# Patient Record
Sex: Female | Born: 1980 | Race: Black or African American | Hispanic: No | Marital: Single | Smoking: Never smoker
Health system: Southern US, Community
[De-identification: ages and names within clinical notes are randomized; demographics above are authoritative.]

## PROBLEM LIST (undated history)

## (undated) DIAGNOSIS — D649 Anemia, unspecified: Secondary | ICD-10-CM

## (undated) HISTORY — DX: Anemia, unspecified: D64.9

## (undated) HISTORY — PX: UTERINE FIBROID SURGERY: SHX826

## (undated) HISTORY — PX: TOOTH EXTRACTION: SUR596

---

## 2009-01-25 ENCOUNTER — Emergency Department (HOSPITAL_COMMUNITY): Admission: EM | Admit: 2009-01-25 | Discharge: 2009-01-25 | Payer: Self-pay | Admitting: Emergency Medicine

## 2013-04-26 ENCOUNTER — Emergency Department (HOSPITAL_COMMUNITY)
Admission: EM | Admit: 2013-04-26 | Discharge: 2013-04-27 | Disposition: A | Discharge status: Home / Self Care | Payer: 59 | Attending: Emergency Medicine | Admitting: Emergency Medicine

## 2013-04-26 ENCOUNTER — Encounter (HOSPITAL_COMMUNITY): Payer: Self-pay | Admitting: Emergency Medicine

## 2013-04-26 DIAGNOSIS — B9789 Other viral agents as the cause of diseases classified elsewhere: Secondary | ICD-10-CM | POA: Insufficient documentation

## 2013-04-26 DIAGNOSIS — B349 Viral infection, unspecified: Secondary | ICD-10-CM

## 2013-04-26 DIAGNOSIS — Z8742 Personal history of other diseases of the female genital tract: Secondary | ICD-10-CM | POA: Insufficient documentation

## 2013-04-26 DIAGNOSIS — R112 Nausea with vomiting, unspecified: Secondary | ICD-10-CM | POA: Insufficient documentation

## 2013-04-26 NOTE — ED Notes (Signed)
Pt arrived to the ED with a complaint of a headache and generalized body aches.  Pt also has intermittent abdominal pain after eating.  Pt states she has had 4 bouts of emesis in the last 24 hours.  Pt states her symptoms started yesterday am.  Pt states she has no hx of headaches.  Pt's pain in the head is all over.

## 2013-04-27 MED ORDER — ONDANSETRON 8 MG PO TBDP
8.0000 mg | ORAL_TABLET | Freq: Once | ORAL | Status: AC
  Administered 2013-04-27: 8 mg via ORAL
  Filled 2013-04-27: qty 1

## 2013-04-27 MED ORDER — PROMETHAZINE HCL 25 MG PO TABS: 25.0000 mg | ORAL_TABLET | Freq: Four times a day (QID) | ORAL | Status: AC | PRN

## 2013-04-27 NOTE — ED Provider Notes (Signed)
CSN: [id number]     Arrival date & time 04/26/13  2211 History   First MD Initiated Contact with Patient 04/26/13 2357     Chief Complaint  Patient presents with  . Headache  . Generalized Body Aches   HPI  History provided by the patient. Patient is a 33 year old female with history of uterine fibroid surgery otherwise healthy who presents with complaints of general body aches, headache and nausea and vomiting symptoms. Patient reports symptoms began 2 days ago with episode of nausea and vomiting early in the day. She began to feel slightly improved and took some Aleve and went to work as normal. She was turned home she was able to sleep normally. Early this morning she had some similar feelings without any nausea vomiting but was able to go to work normally. When she returned home she began having body aches, headache and episodes of nausea and vomiting. She tried taking Aleve again without any improvement. She reports having intermittent headaches and occasional intermittent abdominal pains. She denies having any associated fever, chills or sweats. No diarrhea or constipation symptoms. Denies any urinary complaints. Last normal menstrual cycle was February 14. She does not believe she is pregnant. No other aggravating or alleviating factors. No other associated symptoms. No recent travel or sick contacts.    History reviewed. No pertinent past medical history. Past Surgical History  Procedure Laterality Date  . Uterine fibroid surgery    . Tooth extraction     No family history on file. History  Substance Use Topics  . Smoking status: Never Smoker   . Smokeless tobacco: Not on file  . Alcohol Use: No   OB History   Grav Para Term Preterm Abortions TAB SAB Ect Mult Living                 Review of Systems  Constitutional: Positive for appetite change. Negative for fever, chills, diaphoresis and fatigue.  HENT: Negative for congestion, rhinorrhea and sinus pressure.   Eyes:  Negative for visual disturbance.  Respiratory: Negative for cough.   Gastrointestinal: Positive for nausea and vomiting. Negative for abdominal pain, diarrhea, constipation and blood in stool.  Endocrine: Negative for polydipsia and polyuria.  Genitourinary: Negative for dysuria, frequency, hematuria, flank pain, vaginal bleeding, vaginal discharge and pelvic pain.      Allergies  Review of patient's allergies indicates no known allergies.  Home Medications   Current Outpatient Rx  Name  Route  Sig  Dispense  Refill  . promethazine (PHENERGAN) 25 MG tablet   Oral   Take 1 tablet (25 mg total) by mouth every 6 (six) hours as needed for nausea.   20 tablet   0    BP 128/59  Pulse 94  Temp(Src) 98.3 F (36.8 C) (Oral)  Resp 17  SpO2 100%  LMP 03/26/2013 Physical Exam  Nursing note and vitals reviewed. Constitutional: She is oriented to person, place, and time. She appears well-developed and well-nourished. No distress.  HENT:  Head: Normocephalic.  Right Ear: Tympanic membrane normal.  Left Ear: Tympanic membrane normal.  Mouth/Throat: Oropharynx is clear and moist.  Eyes: Conjunctivae and EOM are normal. Pupils are equal, round, and reactive to light.  Neck: Normal range of motion. Neck supple.  Cardiovascular: Normal rate and regular rhythm.   No murmur heard. Pulmonary/Chest: Effort normal and breath sounds normal. No respiratory distress. She has no wheezes. She has no rales.  Abdominal: Soft. She exhibits no distension. There is no tenderness. There  is no rebound and no guarding.  Musculoskeletal: Normal range of motion. She exhibits no edema and no tenderness.  Lymphadenopathy:    She has no cervical adenopathy.  Neurological: She is alert and oriented to person, place, and time.  Skin: Skin is warm and dry. No rash noted.  Psychiatric: She has a normal mood and affect. Her behavior is normal.    ED Course  Procedures   DIAGNOSTIC STUDIES: Oxygen Saturation  is 100% on room air.    COORDINATION OF CARE:  Nursing notes reviewed. Vital signs reviewed. Initial pt interview and examination performed.   1:15 AM-patient seen and evaluated. Patient well-appearing no acute distress. Afebrile at triage. Unremarkable vital signs. Unremarkable examination. I discussed with patient options for some basic workup to evaluate her symptoms including urine pregnancy test, CBG or basic lab tests. At this time patient does not wish to stay to have additional testing and prefers to continue to monitor symptoms for the next few days. She was strongly encouraged to return if she has any change or worsening symptoms or if her symptoms persist for more than one week. She agrees with this plan.    Treatment plan initiated: Medications  ondansetron (ZOFRAN-ODT) disintegrating tablet 8 mg (8 mg Oral Given 04/27/13 0114)       MDM   Final diagnoses:  Viral infection        Angus Seller, PA-C 04/27/13 0120

## 2013-04-27 NOTE — ED Provider Notes (Signed)
Medical screening examination/treatment/procedure(s) were performed by non-physician practitioner and as supervising physician I was immediately available for consultation/collaboration.   Dione Boozeavid Moriyah Byington, MD 04/27/13 [phone]

## 2013-04-27 NOTE — Discharge Instructions (Signed)
Please followup with a primary care provider for continued evaluation and treatment. Return at any time for changing or worsening symptoms.    Viral Infections A virus is a type of germ. Viruses can cause:  Minor sore throats.  Aches and pains.  Headaches.  Runny nose.  Rashes.  Watery eyes.  Tiredness.  Coughs.  Loss of appetite.  Feeling sick to your stomach (nausea).  Throwing up (vomiting).  Watery poop (diarrhea). HOME CARE   Only take medicines as told by your doctor.  Drink enough water and fluids to keep your pee (urine) clear or pale yellow. Sports drinks are a good choice.  Get plenty of rest and eat healthy. Soups and broths with crackers or rice are fine. GET HELP RIGHT AWAY IF:   You have a very bad headache.  You have shortness of breath.  You have chest pain or neck pain.  You have an unusual rash.  You cannot stop throwing up.  You have watery poop that does not stop.  You cannot keep fluids down.  You or your child has a temperature by mouth above 102 F (38.9 C), not controlled by medicine.  Your baby is older than 3 months with a rectal temperature of 102 F (38.9 C) or higher.  Your baby is 34 months old or younger with a rectal temperature of 100.4 F (38 C) or higher. MAKE SURE YOU:   Understand these instructions.  Will watch this condition.  Will get help right away if you are not doing well or get worse. Document Released: 01/28/2008 Document Revised: 05/09/2011 Document Reviewed: 06/22/2010 [facility] Patient Information 2014 [place], [place].    Emergency Department Resource Guide 1) Find a Doctor and Pay Out of Pocket Although you won't have to find out who is covered by your insurance plan, it is a good idea to ask around and get recommendations. You will then need to call the office and see if the doctor you have chosen will accept you as a new patient and what types of options they offer for patients who are  self-pay. Some doctors offer discounts or will set up payment plans for their patients who do not have insurance, but you will need to ask so you aren't surprised when you get to your appointment.  2) Contact Your Local Health Department Not all health departments have doctors that can see patients for sick visits, but many do, so it is worth a call to see if yours does. If you don't know where your local health department is, you can check in your phone book. The CDC also has a tool to help you locate your state's health department, and many state websites also have listings of all of their local health departments.  3) Find a Walk-in Clinic If your illness is not likely to be very severe or complicated, you may want to try a walk in clinic. These are popping up all over the country in pharmacies, drugstores, and shopping centers. They're usually staffed by nurse practitioners or physician assistants that have been trained to treat common illnesses and complaints. They're usually fairly quick and inexpensive. However, if you have serious medical issues or chronic medical problems, these are probably not your best option.  No Primary Care Doctor: - Call Health Connect at  [phone] - they can help you locate a primary care doctor that  accepts your insurance, provides certain services, etc. - Physician Referral Service- [phone]  Chronic Pain Problems: Organization  Address  Phone   Notes  [facility]  [phone] Patients need to be referred by their primary care doctor.   Medication Assistance: Organization         Address  Phone   Notes  [facility] Medication [facility] [address] [place], [place] [address] [phone] --Must be a resident of [place] -- Must have NO insurance coverage whatsoever (no Medicaid/ Medicare, etc.) -- The pt. MUST have a primary care doctor that directs their care regularly and follows them in  the community   Med[facility]ist  [phone]) [phone]   [employer]  [phone]    Agencies that provide inexpensive medical care: Organization         Address  Phone   Notes  [facility]  [phone]   [facility]    [phone]   [facility] [address] [place], [place] [address] [phone]   [facility] [address], [place] [phone]) [phone]   [facility]anned Parenthood    [phone]) [phone]   [facility]    [phone]) [phone]   [facility] and [facility]  [address], [place] Phone:  [phone], Fax:  [phone] Hours of Operation:  9 am - 6 pm, M-F.  Also accepts Medicaid/Medicare and self-pay.  [facility]  [address], [place] Phone: [phone]) [phone], Fax: [phone]) [phone]. Hours of Operation:  8:30 am - 5:30 pm, M-F.  Also accepts Medicaid and self-pay.  [facility] [place] [address], [place] Phone: [phone]) [phone]   [facility] [address], [place], [place] [phone], Ext. [phone] Mondays & Thursdays: 7-9 AM.  First 15 patients are seen on a first come, first serve basis.    Medicaid-accepting [facility] Providers:  Organization         Address  Phone   Notes  [facility] [address], [place] [phone] Also accepts self-pay patients.  [facility] [address], [place]  [phone]   [facility] [address], [place] [phone]) [phone]   [facility] [address] [place] [address], [place] [phone]) [phone]   Renaye RakersVeita Bland [address], [place]   [phone] Only accepts [place] Access [place] patients after they have their name applied to their card.   Self-Pay (no insurance) in [facility] [place]:  Organization         Address  Phone   Notes  Sickle Cell Patients,  [facility] [address], [place] [phone]) [phone]   [facility] [address], [place] [phone]) [phone]   [facility] [place]  [address], [place] [phone]   [facility]/Dr. Osei-Bonsu  [address] [place] [address], [place] or [phone], [place] [phone] Phone number for both [place] and [place] locations is the same.  [facility] [address], [place] [phone]) [phone]   [facility] [place] [address] [place] [address], [place] or [address] [phone]) [phone]   [facility] [address], [place] [phone]) [phone], phone; [phone]) [phone], fax Sees patients 1st and 3rd Saturday of every month.  Must not qualify  for public or private insurance (i.e. Medicaid, Medicare, [place] Health Choice, Veterans' Benefits)  Household income should be no more than 200% of the poverty level The clinic cannot treat you if you are pregnant or think you are pregnant  Sexually transmitted diseases are not treated at the clinic.    Dental Care: Organization         Address  Phone  Notes  [facility] [address] [place], [place] [phone] Accepts children up to age 72 who are enrolled in [place] or [place] Health Choice; pregnant women with a Medicaid card; and children who have applied for Medicaid or [place] Health Choice, but were declined, whose parents can pay a reduced fee at time of service.  [facility] of [facility]  [address], [place] [phone] Accepts children up to age 22 who are enrolled in [place] or [place] Health Choice; pregnant women with a Medicaid card; and children who have applied for Medicaid or [place] Health Choice, but were declined, whose parents can pay a reduced fee at time of service.  [facility]   [address] [place], [place] [phone] Patients are seen by appointment only. Walk-ins are not accepted. [facility] will see patients 3 years of age and older. Monday - Tuesday (8am-5pm) Most Wednesdays (8:30-5pm) $30 per visit, cash only  [facility] Adult Dental Access PROGRAM  [address], [facility] [phone] Patients are seen by appointment only. Walk-ins are not accepted. [facility] will see patients 31 years of age and older. One Wednesday Evening (Monthly: Volunteer Based).  $30 per visit, cash only  [employer]  [phone] for adults; Children under age 52, call Graduate Pediatric Dentistry at [phone]. Children aged 10-14, please call [phone] to request a pediatric application.  Dental services are provided in all areas of dental care including fillings, crowns and bridges, complete and partial dentures, implants, gum treatment, root canals, and extractions. Preventive care is also provided. Treatment is provided to both adults and children. Patients are selected via a lottery and there is often a waiting list.   [facility] [address], [place]  [phone] [email or web address]   [facility] [address] [place], [place] [phone], Ext. [phone] Second and Fourth Thursday of each month, opens at 6:30 AM; Clinic ends at 9 AM.  Patients are seen on a first-come first-served basis, and a limited number are seen during each clinic.   [facility]  [address] [place], [place] [phone]   Eligibility Requirements You must have lived in [place], [place], or [place] counties for at least the last three months.   You cannot be eligible for state or federal sponsored [place], including [employer], [place], or [employer].   You generally cannot be eligible for healthcare insurance through your employer.    How to apply: Eligibility screenings are  held every Tuesday and Wednesday afternoon from 1:00 pm until 4:00 pm. You do not need an appointment for the interview!  [facility] [address], [place], [place] [phone]   [facility] Health Department  [phone]   [facility] Health Department  [phone]   [facility] Health Department  [phone]    Behavioral Health Resources in the Community: Intensive Outpatient Programs Organization         Address  Phone  Notes  [facility] [address] [place], [place] [phone]   [facility] [address] [place], [place] [phone]   [facility]: [facility] Drug Sv[facility] [address], [place], [place]  [phone]   [facility] Mental Health [address]  [place] [employer], [place] [phone] or [phone]   Substance Abuse Resources Organization         Address  Phone  Notes  Alcohol and Drug Services  [phone]   [facility]  [phone]   [facility] [place]  [phone]   Floydene Flock  [phone]   Residential & Outpatient Substance Abuse Program  [phone]   Psychological Services Organization         Address  Phone  Notes  [facility] Behavioral Health  [phone]   [facility] Services  [phone]   [facility] Mental Health [address] [place] [phone] or [phone]    Mobile Crisis Teams Organization         Address  Phone  Notes  [facility], Mobile Crisis Care Unit  [phone]   [facility]  [address] [place], [place] [phone]   Doristine Locks [address] [place] [phone]    Self-Help/Support Groups Organization         Address  Phone             Notes  [facility]oc[facility] of [place] - variety of support groups  [phone]- I7437963 Call for more information  Narcotics Anonymous (NA), [facility] [address], [employer] [place]  2 meetings at this location     Statistician         Address  Phone  Notes  [facility] [address],    [place]  [phone]   [facility]  [address] [place] [address], [place], [place] [phone]   [facility] [address] [place], [place] [phone] Admissions: 8am-3pm M-F  [facility] [address],    [place], [place] [phone]   [facility] [address] [place], [place], [place] [phone]   [facility] [address],  [place], [place] [phone]   [facility] - Intensive Outpatient [address], [place], [place] [phone]   [facility] (Addiction [facility] [address] [place]  [place], [place] [phone] or [phone]   [facility] ([facility]) [address] [place], [place] [phone] Accepts Medicaid  [facility] [address]  [place] [phone] Substance Abuse/Addiction Treatment   [facility] Organization         Address  Phone  Notes  [facility]  [phone]   Angie Fava, PhD [address] [place], [place]   [phone] or [phone]   [facility] Behavioral   [address] [place], [place] [phone]   [facility] [address] [place], [place] [phone] Insurance/Medicaid/sponsorship through [facility] [address]                                    [place], [place] [phone] Therapy/tele-psych/case  [facility] [address] [place], [place] [phone]    Dr. Lolly Mustache  [phone]   [facility]  [address]  [facility] Dept. 1) [address] [place] 2) [address], [place] 3)  [address], [place] [phone]  [phone]   [facility] Child Abuse Hotline 979-678-9356 or [phone] (After  Hours)

## 2015-05-26 ENCOUNTER — Encounter (HOSPITAL_COMMUNITY): Payer: Self-pay

## 2015-05-26 ENCOUNTER — Emergency Department (HOSPITAL_COMMUNITY)
Admission: EM | Admit: 2015-05-26 | Discharge: 2015-05-26 | Disposition: A | Discharge status: Home / Self Care | Payer: BLUE CROSS/BLUE SHIELD | Attending: Emergency Medicine | Admitting: Emergency Medicine

## 2015-05-26 DIAGNOSIS — K625 Hemorrhage of anus and rectum: Secondary | ICD-10-CM | POA: Insufficient documentation

## 2015-05-26 DIAGNOSIS — Z86018 Personal history of other benign neoplasm: Secondary | ICD-10-CM | POA: Diagnosis not present

## 2015-05-26 DIAGNOSIS — D649 Anemia, unspecified: Secondary | ICD-10-CM

## 2015-05-26 LAB — TYPE AND SCREEN
ABO/RH(D): A POS
ANTIBODY SCREEN: NEGATIVE

## 2015-05-26 LAB — CBC
HEMATOCRIT: 30 % — AB (ref 36.0–46.0)
Hemoglobin: 9.7 g/dL — ABNORMAL LOW (ref 12.0–15.0)
MCH: 24.5 pg — ABNORMAL LOW (ref 26.0–34.0)
MCHC: 32.3 g/dL (ref 30.0–36.0)
MCV: 75.8 fL — ABNORMAL LOW (ref 78.0–100.0)
Platelets: 372 10*3/uL (ref 150–400)
RBC: 3.96 MIL/uL (ref 3.87–5.11)
RDW: 15 % (ref 11.5–15.5)
WBC: 6.6 10*3/uL (ref 4.0–10.5)

## 2015-05-26 LAB — COMPREHENSIVE METABOLIC PANEL
ALBUMIN: 4.3 g/dL (ref 3.5–5.0)
ALK PHOS: 66 U/L (ref 38–126)
ALT: 12 U/L — AB (ref 14–54)
AST: 14 U/L — AB (ref 15–41)
Anion gap: 9 (ref 5–15)
BUN: 15 mg/dL (ref 6–20)
CO2: 24 mmol/L (ref 22–32)
Calcium: 9.2 mg/dL (ref 8.9–10.3)
Chloride: 106 mmol/L (ref 101–111)
Creatinine, Ser: 0.63 mg/dL (ref 0.44–1.00)
GFR calc Af Amer: >60 mL/min (ref >60)
GFR calc non Af Amer: >60 mL/min (ref >60)
Glucose, Bld: 97 mg/dL (ref 65–99)
POTASSIUM: 3.6 mmol/L (ref 3.5–5.1)
Sodium: 139 mmol/L (ref 135–145)
TOTAL PROTEIN: 8 g/dL (ref 6.5–8.1)
Total Bilirubin: 0.4 mg/dL (ref 0.3–1.2)

## 2015-05-26 LAB — ABO/RH: ABO/RH(D): A POS

## 2015-05-26 LAB — POC OCCULT BLOOD, ED: Fecal Occult Bld: NEGATIVE

## 2015-05-26 NOTE — ED Notes (Addendum)
Pt with rectal bleeding.  Pt with pressure before and after bm.  Pt with nausea.  Pt straining.  Has happened before but didn't last this long.  No pain.  Pt has hx of anemia

## 2015-05-26 NOTE — Discharge Instructions (Signed)
Schedule a follow up appointment with [facility]. You will need to restart your iron pills as your hemoglobin was low today. Schedule a follow up appointment with a PCP to monitor this. Return to ED with new, worsening or concerning symptoms.    Gastrointestinal Bleeding Gastrointestinal (GI) bleeding means there is bleeding somewhere along the digestive tract, between the mouth and anus. CAUSES  There are many different problems that can cause GI bleeding. Possible causes include:  Esophagitis. This is inflammation, irritation, or swelling of the esophagus.  Hemorrhoids.These are veins that are full of blood (engorged) in the rectum. They cause pain, inflammation, and may bleed.  Anal fissures.These are areas of painful tearing which may bleed. They are often caused by passing hard stool.  Diverticulosis.These are pouches that form on the colon over time, with age, and may bleed significantly.  Diverticulitis.This is inflammation in areas with diverticulosis. It can cause pain, fever, and bloody stools, although bleeding is rare.  Polyps and cancer. Colon cancer often starts out as precancerous polyps.  Gastritis and ulcers.Bleeding from the upper gastrointestinal tract (near the stomach) may travel through the intestines and produce black, sometimes tarry, often bad smelling stools. In certain cases, if the bleeding is fast enough, the stools may not be black, but red. This condition may be life-threatening. SYMPTOMS   Vomiting bright red blood or material that looks like coffee grounds.  Bloody, black, or tarry stools. DIAGNOSIS  Your caregiver may diagnose your condition by taking your history and performing a physical exam. More tests may be needed, including:  X-rays and other imaging tests.  Esophagogastroduodenoscopy (EGD). This test uses a flexible, lighted tube to look at your esophagus, stomach, and small intestine.  Colonoscopy. This test uses a  flexible, lighted tube to look at your colon. TREATMENT  Treatment depends on the cause of your bleeding.   For bleeding from the esophagus, stomach, small intestine, or colon, the caregiver doing your EGD or colonoscopy may be able to stop the bleeding as part of the procedure.  Inflammation or infection of the colon can be treated with medicines.  Many rectal problems can be treated with creams, suppositories, or warm baths.  Surgery is sometimes needed.  Blood transfusions are sometimes needed if you have lost a lot of blood. If bleeding is slow, you may be allowed to go home. If there is a lot of bleeding, you will need to stay in the hospital for observation. HOME CARE INSTRUCTIONS   Take any medicines exactly as prescribed.  Keep your stools soft by eating foods that are high in fiber. These foods include whole grains, legumes, fruits, and vegetables. Prunes (1 to 3 a day) work well for many people.  Drink enough fluids to keep your urine clear or pale yellow. SEEK IMMEDIATE MEDICAL CARE IF:   Your bleeding increases.  You feel lightheaded, weak, or you faint.  You have severe cramps in your back or abdomen.  You pass large blood clots in your stool.  Your problems are getting worse. MAKE SURE YOU:   Understand these instructions.  Will watch your condition.  Will get help right away if you are not doing well or get worse.   This information is not intended to replace advice given to you by your health care provider. Make sure you discuss any questions you have with your health care provider.   Document Released: 02/12/2000 Document Revised: 02/01/2012 Document Reviewed: 08/04/2014 Elsevier Interactive Patient Education 2016 [employer]. Iron Deficiency  Anemia, Adult Anemia is a condition in which there are less red blood cells or hemoglobin in the blood than normal. Hemoglobin is the part of red blood cells that carries oxygen. Iron deficiency anemia is anemia  caused by too little iron. It is the most common type of anemia. It may leave you tired and short of breath. CAUSES   Lack of iron in the diet.  Poor absorption of iron, as seen with intestinal disorders.  Intestinal bleeding.  Heavy periods. SIGNS AND SYMPTOMS  Mild anemia may not be noticeable. Symptoms may include:  Fatigue.  Headache.  Pale skin.  Weakness.  Tiredness.  Shortness of breath.  Dizziness.  Cold hands and feet.  Fast or irregular heartbeat. DIAGNOSIS  Diagnosis requires a thorough evaluation and physical exam by your health care provider. Blood tests are generally used to confirm iron deficiency anemia. Additional tests may be done to find the underlying cause of your anemia. These may include:  Testing for blood in the stool (fecal occult blood test).  A procedure to see inside the colon and rectum (colonoscopy).  A procedure to see inside the esophagus and stomach (endoscopy). TREATMENT  Iron deficiency anemia is treated by correcting the cause of the deficiency. Treatment may involve:  Adding iron-rich foods to your diet.  Taking iron supplements. Pregnant or breastfeeding women need to take extra iron because their normal diet usually does not provide the required amount.  Taking vitamins. Vitamin C improves the absorption of iron. Your health care provider may recommend that you take your iron tablets with a glass of orange juice or vitamin C supplement.  Medicines to make heavy menstrual flow lighter.  Surgery. HOME CARE INSTRUCTIONS   Take iron as directed by your health care provider.  If you cannot tolerate taking iron supplements by mouth, talk to your health care provider about taking them through a vein (intravenously) or an injection into a muscle.  For the best iron absorption, iron supplements should be taken on an empty stomach. If you cannot tolerate them on an empty stomach, you may need to take them with food.  Do not drink  milk or take antacids at the same time as your iron supplements. Milk and antacids may interfere with the absorption of iron.  Iron supplements can cause constipation. Make sure to include fiber in your diet to prevent constipation. A stool softener may also be recommended.  Take vitamins as directed by your health care provider.  Eat a diet rich in iron. Foods high in iron include liver, lean beef, whole-grain bread, eggs, dried fruit, and dark green leafy vegetables. SEEK IMMEDIATE MEDICAL CARE IF:   You faint. If this happens, do not drive. Call your local emergency services (911 in [place] if no other help is available.  You have chest pain.  You feel nauseous or vomit.  You have severe or increased shortness of breath with activity.  You feel weak.  You have a rapid heartbeat.  You have unexplained sweating.  You become light-headed when getting up from a chair or bed. MAKE SURE YOU:   Understand these instructions.  Will watch your condition.  Will get help right away if you are not doing well or get worse.   This information is not intended to replace advice given to you by your health care provider. Make sure you discuss any questions you have with your health care provider.   Document Released: 02/12/2000 Document Revised: 03/07/2014 Document Reviewed: 10/22/2012 Elsevier Interactive  Patient Education 2016 [employer].

## 2015-05-26 NOTE — ED Provider Notes (Signed)
CSN: [id number]     Arrival date & time 05/26/15  1354 History   First MD Initiated Contact with Patient 05/26/15 1709     Chief Complaint  Patient presents with  . Rectal Bleeding   HPI  [name] is a 35 year old female with PMHx of iron deficiency anemia and uterine fibroids presenting with rectal bleeding. She reports sensation of lower pelvic pressure that radiates through to her rectum over the past week. The sensation is most severe before and after bowel movements. She had some bright red blood per rectum intermittently over the past week. She reports one episode of straining to have a bowel movement but otherwise has been having normal stools. She does note increased frequency of bowel movements. No diarrhea. Over the past 3 days, the volume of bleeding has increased. She states that the blood is dark red and she occasionally passes clots. She states that she drips blood when she is sitting on the toilet. Denies rectal bleeding between bowel movements. She has been wearing a pad over the past week to prevent bleeding but has never noted blood on it. She also endorses mild associated nausea. She also notes that she has lost 20 pounds in the past few months. She states that her sister recently had cancerous polyps removed from her colon. She states that her sister is a private person so she does not know much more about it than that. She denies a family history of colon cancer. She does note a long history of iron deficiency anemia. She is supposed to be taking iron pills but does not like the side effects. Her primary care provider retired so no one has been following her anemia. She is unsure what her hemoglobin typically is. She has no other complaints today. Denies fevers, chills, night sweats, dizziness, lightheadedness, syncope, chest pain, shortness of breath or vaginal bleeding.   History reviewed. No pertinent past medical history. Past Surgical History  Procedure Laterality Date  .  Uterine fibroid surgery    . Tooth extraction     History reviewed. No pertinent family history. Social History  Substance Use Topics  . Smoking status: Never Smoker   . Smokeless tobacco: None  . Alcohol Use: No   OB History    No data available     Review of Systems  All other systems reviewed and are negative.     Allergies  Review of patient's allergies indicates no known allergies.  Home Medications   Prior to Admission medications   Medication Sig Start Date End Date Taking? Authorizing Provider  ibuprofen (ADVIL,MOTRIN) 200 MG tablet Take 400 mg by mouth every 6 (six) hours as needed for headache.   Yes Historical Provider, MD  promethazine (PHENERGAN) 25 MG tablet Take 1 tablet (25 mg total) by mouth every 6 (six) hours as needed for nausea. Patient not taking: Reported on 05/26/2015 04/27/13   Ivonne AndrewPeter Dammen, PA-C   BP 133/81 mmHg  Pulse 90  Temp(Src) 98.1 F (36.7 C) (Oral)  Resp 17  SpO2 100%  LMP 05/16/2015 Physical Exam  Constitutional: She appears well-developed and well-nourished. No distress.  Nontoxic appearing  HENT:  Head: Normocephalic and atraumatic.  Eyes: Conjunctivae are normal. Right eye exhibits no discharge. Left eye exhibits no discharge. No scleral icterus.  Neck: Normal range of motion.  Cardiovascular: Normal rate and regular rhythm.   Pulmonary/Chest: Effort normal. No respiratory distress.  Abdominal: Soft. Bowel sounds are normal. She exhibits no distension and no mass.  There is no tenderness. There is no rebound and no guarding.  Genitourinary: Rectal exam shows no external hemorrhoid, no fissure, no mass and no tenderness.  No anal fissures or external hemorrhoids on exam. No frank blood on exam. No stool in rectal vault. No tenderness on exam. No masses palpated.   Musculoskeletal: Normal range of motion.  Neurological: She is alert. Coordination normal.  Skin: Skin is warm and dry. No pallor.  Psychiatric: She has a normal mood  and affect. Her behavior is normal.  Nursing note and vitals reviewed.   ED Course  Procedures (including critical care time) Labs Review Labs Reviewed  COMPREHENSIVE METABOLIC PANEL - Abnormal; Notable for the following:    AST 14 (*)    ALT 12 (*)    All other components within normal limits  CBC - Abnormal; Notable for the following:    Hemoglobin 9.7 (*)    HCT 30.0 (*)    MCV 75.8 (*)    MCH 24.5 (*)    All other components within normal limits  POC OCCULT BLOOD, ED  POC OCCULT BLOOD, ED  TYPE AND SCREEN  ABO/RH    Imaging Review No results found. I have personally reviewed and evaluated these images and lab results as part of my medical decision-making.   EKG Interpretation None      MDM   Final diagnoses:  Rectal bleeding  Anemia, unspecified anemia type   35 year old female presenting with rectal bleeding 1 week. Sister recently had polyps removed. Patient denies family history of colon cancer. Afebrile and hemodynamically stable. Patient is nontoxic-appearing. No pallor noted. Abdomen is soft, nontender without peritoneal signs. No anal fissures or external hemorrhoids noted on rectal exam. No masses palpated. Hemoccult negative. Hemoglobin 9.7. Patient is unsure of where her hemoglobin typically is but states it is always low. Remaining blood work unremarkable. Patient is appropriate for outpatient follow-up with gastroenterology for colonoscopy. Also discussed the importance of taking her iron supplements. Given community health and wellness referral information to establish care with a PCP to monitor her hemoglobin.  At this time there does not appear to be any evidence of an acute emergency medical condition and the patient appears stable for discharge with appropriate outpatient follow up. Diagnosis was discussed with patient who verbalizes understanding and is agreeable to discharge. Pt case discussed with Dr. Rubin Payor who agrees with my plan. Return  precautions given in discharge paperwork and discussed with pt at bedside. Pt is stable for discharge.     Alveta Heimlich, PA-C 05/26/15 1910  Benjiman Core, MD 05/27/15 [phone]

## 2016-10-20 ENCOUNTER — Ambulatory Visit (INDEPENDENT_AMBULATORY_CARE_PROVIDER_SITE_OTHER): Payer: BLUE CROSS/BLUE SHIELD | Admitting: Emergency Medicine

## 2016-10-20 ENCOUNTER — Encounter: Payer: Self-pay | Admitting: Emergency Medicine

## 2016-10-20 VITALS — BP 132/82 | HR 80 | Temp 98.6°F | Resp 16 | Ht 63.75 in | Wt 180.2 lb

## 2016-10-20 DIAGNOSIS — B354 Tinea corporis: Secondary | ICD-10-CM

## 2016-10-20 MED ORDER — CLOTRIMAZOLE-BETAMETHASONE 1-0.05 % EX CREA: 1.0000 "application " | TOPICAL_CREAM | Freq: Two times a day (BID) | CUTANEOUS | 0 refills | Status: AC

## 2016-10-20 NOTE — Patient Instructions (Addendum)
   IF you received an x-ray today, you will receive an invoice from [facility]. Please contact [facility] at [phone] with questions or concerns regarding your invoice.   IF you received labwork today, you will receive an invoice from [facility]. Please contact [facility] at [phone] with questions or concerns regarding your invoice.   Our billing staff will not be able to assist you with questions regarding bills from these companies.  You will be contacted with the lab results as soon as they are available. The fastest way to get your results is to activate your My Chart account. Instructions are located on the last page of this paperwork. If you have not heard from us regarding the results in 2 weeks, please contact this office.     Body Ringworm Body ringworm is an infection of the skin that often causes a ring-shaped rash. Body ringworm can affect any part of your skin. It can spread easily to others. Body ringworm is also called tinea corporis. What are the causes? This condition is caused by funguses called dermatophytes. The condition develops when these funguses grow out of control on the skin. You can get this condition if you touch a person or animal that has it. You can also get it if you share clothing, bedding, towels, or any other object with an infected person or pet. What increases the risk? This condition is more likely to develop in:  Athletes who often make skin-to-skin contact with other athletes, such as wrestlers.  People who share equipment and mats.  People with a weakened immune system.  What are the signs or symptoms? Symptoms of this condition include:  Itchy, raised red spots and bumps.  Red scaly patches.  A ring-shaped rash. The rash may have: ? A clear center. ? Scales or red bumps at its center. ? Redness near its borders. ? Dry and scaly skin on or around it.  How is this diagnosed? This condition can usually be  diagnosed with a skin exam. A skin scraping may be taken from the affected area and examined under a microscope to see if the fungus is present. How is this treated? This condition may be treated with:  An antifungal cream or ointment.  An antifungal shampoo.  Antifungal medicines. These may be prescribed if your ringworm is severe, keeps coming back, or lasts a long time.  Follow these instructions at home:  Take over-the-counter and prescription medicines only as told by your health care provider.  If you were given an antifungal cream or ointment: ? Use it as told by your health care provider. ? Wash the infected area and dry it completely before applying the cream or ointment.  If you were given an antifungal shampoo: ? Use it as told by your health care provider. ? Leave the shampoo on your body for 3-5 minutes before rinsing.  While you have a rash: ? Wear loose clothing to stop clothes from rubbing and irritating it. ? Wash or change your bed sheets every night.  If your pet has the same infection, take your pet to see a veterinarian. How is this prevented?  Practice good hygiene.  Wear sandals or shoes in public places and showers.  Do not share personal items with others.  Avoid touching red patches of skin on other people.  Avoid touching pets that have bald spots.  If you touch an animal that has a bald spot, wash your hands. Contact a health care provider if:    Your rash continues to spread after 7 days of treatment.  Your rash is not gone in 4 weeks.  The area around your rash gets red, warm, tender, and swollen. This information is not intended to replace advice given to you by your health care provider. Make sure you discuss any questions you have with your health care provider. Document Released: 02/12/2000 Document Revised: 07/23/2015 Document Reviewed: 12/11/2014 Elsevier Interactive Patient Education  2018 [employer]   

## 2016-10-20 NOTE — Progress Notes (Signed)
Pamela Skinner 36 y.o.   Chief Complaint  Patient presents with  . Rash    on abdominal area with itching x 2 months    HISTORY OF PRESENT ILLNESS: This is a 36 y.o. female complaining of rash to right lower abdominal area for close to 2 months. No other significant symptoms.  HPI   Prior to Admission medications   Medication Sig Start Date End Date Taking? Authorizing Provider  Dextrose-Fructose-Sod Citrate (NAUZENE PO) Take by mouth as needed.   Yes [provider]  ibuprofen (ADVIL,MOTRIN) 200 MG tablet Take 400 mg by mouth every 6 (six) hours as needed for headache.   Yes [provider]  promethazine (PHENERGAN) 25 MG tablet Take 1 tablet (25 mg total) by mouth every 6 (six) hours as needed for nausea. Patient not taking: Reported on 05/26/2015 04/27/13   Ivonne Andrew, PA-C    No Known Allergies  There are no active problems to display for this patient.   Past Medical History:  Diagnosis Date  . Anemia     Past Surgical History:  Procedure Laterality Date  . TOOTH EXTRACTION    . UTERINE FIBROID SURGERY      Social History   Social History  . Marital status: Single    Spouse name: N/A  . Number of children: N/A  . Years of education: N/A   Occupational History  . Not on file.   Social History Main Topics  . Smoking status: Never Smoker  . Smokeless tobacco: Never Used  . Alcohol use No  . Drug use: No  . Sexual activity: Yes   Other Topics Concern  . Not on file   Social History Narrative  . No narrative on file    Family History  Problem Relation Age of Onset  . Hyperlipidemia Sister   . Heart disease Brother   . Hyperlipidemia Brother   . Cancer Sister   . Hyperlipidemia Brother      Review of Systems  Constitutional: Negative.  Negative for chills and fever.  HENT: Negative for sore throat.   Eyes: Negative for discharge and redness.  Respiratory: Negative for cough and shortness of breath.   Cardiovascular:  Negative for chest pain, palpitations and leg swelling.  Gastrointestinal: Negative for abdominal pain, diarrhea, nausea and vomiting.  Genitourinary: Negative for dysuria and hematuria.  Skin: Positive for itching and rash.  Neurological: Negative for dizziness and headaches.  Endo/Heme/Allergies: Negative.   All other systems reviewed and are negative.   Vitals:   10/20/16 1047  BP: 132/82  Pulse: 80  Resp: 16  Temp: 98.6 F (37 C)  SpO2: 100%    Physical Exam  Constitutional: She is oriented to person, place, and time. She appears well-developed and well-nourished.  HENT:  Head: Normocephalic and atraumatic.  Eyes: Pupils are equal, round, and reactive to light. EOM are normal.  Neck: Normal range of motion.  Cardiovascular: Normal rate.   Pulmonary/Chest: Effort normal.  Abdominal: Soft. There is no tenderness.  Musculoskeletal: Normal range of motion.  Neurological: She is alert and oriented to person, place, and time.  Skin: Skin is warm and dry. Capillary refill takes less than 2 seconds. Rash noted.  +ringworm lesion to right lower abdomen area.  Psychiatric: She has a normal mood and affect. Her behavior is normal.  Vitals reviewed.    ASSESSMENT & PLAN: Pamela Skinner was seen today for rash.  Diagnoses and all orders for this visit:  Ringworm of body  Other orders -  clotrimazole-betamethasone (LOTRISONE) cream; Apply 1 application topically 2 (two) times daily.    Patient Instructions       IF you received an x-ray today, you will receive an invoice from [facility]. Please contact [facility] at [phone] with questions or concerns regarding your invoice.   IF you received labwork today, you will receive an invoice from [facility]. Please contact [facility] at [phone] with questions or concerns regarding your invoice.   Our billing staff will not be able to assist you with questions regarding bills from these companies.  You  will be contacted with the lab results as soon as they are available. The fastest way to get your results is to activate your My Chart account. Instructions are located on the last page of this paperwork. If you have not heard from [place] regarding the results in 2 weeks, please contact this office.     Body Ringworm Body ringworm is an infection of the skin that often causes a ring-shaped rash. Body ringworm can affect any part of your skin. It can spread easily to others. Body ringworm is also called tinea corporis. What are the causes? This condition is caused by funguses called dermatophytes. The condition develops when these funguses grow out of control on the skin. You can get this condition if you touch a person or animal that has it. You can also get it if you share clothing, bedding, towels, or any other object with an infected person or pet. What increases the risk? This condition is more likely to develop in:  Athletes who often make skin-to-skin contact with other athletes, such as wrestlers.  People who share equipment and mats.  People with a weakened immune system.  What are the signs or symptoms? Symptoms of this condition include:  Itchy, raised red spots and bumps.  Red scaly patches.  A ring-shaped rash. The rash may have: ? A clear center. ? Scales or red bumps at its center. ? Redness near its borders. ? Dry and scaly skin on or around it.  How is this diagnosed? This condition can usually be diagnosed with a skin exam. A skin scraping may be taken from the affected area and examined under a microscope to see if the fungus is present. How is this treated? This condition may be treated with:  An antifungal cream or ointment.  An antifungal shampoo.  Antifungal medicines. These may be prescribed if your ringworm is severe, keeps coming back, or lasts a long time.  Follow these instructions at home:  Take over-the-counter and prescription medicines only as  told by your health care provider.  If you were given an antifungal cream or ointment: ? Use it as told by your health care provider. ? Wash the infected area and dry it completely before applying the cream or ointment.  If you were given an antifungal shampoo: ? Use it as told by your health care provider. ? Leave the shampoo on your body for 3-5 minutes before rinsing.  While you have a rash: ? Wear loose clothing to stop clothes from rubbing and irritating it. ? Wash or change your bed sheets every night.  If your pet has the same infection, take your pet to see a International aid/development worker. How is this prevented?  Practice good hygiene.  Wear sandals or shoes in public places and showers.  Do not share personal items with others.  Avoid touching red patches of skin on other people.  Avoid touching pets that have bald spots.  If you touch an animal that has a bald spot, wash your hands. Contact a health care provider if:  Your rash continues to spread after 7 days of treatment.  Your rash is not gone in 4 weeks.  The area around your rash gets red, warm, tender, and swollen. This information is not intended to replace advice given to you by your health care provider. Make sure you discuss any questions you have with your health care provider. Document Released: 02/12/2000 Document Revised: 07/23/2015 Document Reviewed: 12/11/2014 Elsevier Interactive Patient Education  2018 Elsevier Inc.      Edwina Barth, MD [facility]

## 2017-03-15 ENCOUNTER — Ambulatory Visit (INDEPENDENT_AMBULATORY_CARE_PROVIDER_SITE_OTHER): Payer: BLUE CROSS/BLUE SHIELD | Admitting: Physician Assistant

## 2017-03-15 ENCOUNTER — Other Ambulatory Visit: Payer: Self-pay

## 2017-03-15 ENCOUNTER — Encounter: Payer: Self-pay | Admitting: Physician Assistant

## 2017-03-15 VITALS — BP 134/85 | HR 90 | Temp 98.6°F | Resp 16 | Ht 63.75 in | Wt 187.4 lb

## 2017-03-15 DIAGNOSIS — M542 Cervicalgia: Secondary | ICD-10-CM

## 2017-03-15 DIAGNOSIS — M62838 Other muscle spasm: Secondary | ICD-10-CM

## 2017-03-15 LAB — POCT URINE PREGNANCY: Preg Test, Ur: NEGATIVE

## 2017-03-15 MED ORDER — MELOXICAM 15 MG PO TABS: 15.0000 mg | ORAL_TABLET | Freq: Every day | ORAL | 0 refills | Status: AC

## 2017-03-15 MED ORDER — CYCLOBENZAPRINE HCL 10 MG PO TABS: 5.0000 mg | ORAL_TABLET | Freq: Three times a day (TID) | ORAL | 0 refills | Status: AC | PRN

## 2017-03-15 NOTE — Patient Instructions (Addendum)
  If you are not feeling better in the next 2 weeks it would be reasonable to get an x-ray of your neck.  I expect that in 10 days she will no longer be having any symptoms.  Please take the medication as prescribed.   IF you received an x-ray today, you will receive an invoice from [facility]. Please contact [facility] at [phone] with questions or concerns regarding your invoice.   IF you received labwork today, you will receive an invoice from [facility]. Please contact [facility] at [phone] with questions or concerns regarding your invoice.   Our billing staff will not be able to assist you with questions regarding bills from these companies.  You will be contacted with the lab results as soon as they are available. The fastest way to get your results is to activate your My Chart account. Instructions are located on the last page of this paperwork. If you have not heard from us regarding the results in 2 weeks, please contact this office.

## 2017-03-15 NOTE — Progress Notes (Signed)
03/15/2017 1:35 PM   DOB: 03-Mar-1980 / MRN: [id number]  SUBJECTIVE:  Pamela Skinner is a 37 y.o. female presenting for right-sided burning trapezius pain.  States that this started as a crick in the neck about 4 days ago and she feels she is getting worse.  Denies numbness and weakness in the right hand.  Has tried 800 mg of ibuprofen this morning but has not been taking any medicine previous to today.  Tells me she is not pregnant however is sexually active and does not take birth control.  No traumas.  She has No Known Allergies.   She  has a past medical history of Anemia.    She  reports that  has never smoked. she has never used smokeless tobacco. She reports that she does not drink alcohol or use drugs. She  reports that she currently engages in sexual activity. The patient  has a past surgical history that includes Uterine fibroid surgery and Tooth extraction.  Her family history includes Cancer in her sister; Heart disease in her brother; Hyperlipidemia in her brother, brother, and sister.  Review of Systems  Constitutional: Negative for fever.  Cardiovascular: Negative for chest pain and leg swelling.  Musculoskeletal: Positive for myalgias and neck pain. Negative for back pain, falls and joint pain.  Skin: Negative for rash.  Neurological: Negative for headaches.    The problem list and medications were reviewed and updated by myself where necessary and exist elsewhere in the encounter.   OBJECTIVE:  BP 134/85 (BP Location: Left Arm, Patient Position: Sitting, Cuff Size: Normal)   Pulse 90   Temp 98.6 F (37 C) (Oral)   Resp 16   Ht 5' 3.75" (1.619 m)   Wt 187 lb 6.4 oz (85 kg)   LMP 02/20/2017   SpO2 100%   BMI 32.42 kg/m   Physical Exam  Constitutional: She is oriented to person, place, and time. She is active.  Non-toxic appearance.  Cardiovascular: Normal rate, regular rhythm, S1 normal, S2 normal, normal heart sounds and intact distal pulses. Exam reveals no  gallop, no friction rub and no decreased pulses.  No murmur heard. Pulmonary/Chest: Effort normal. No stridor. No tachypnea. No respiratory distress. She has no wheezes. She has no rales.  Abdominal: She exhibits no distension.  Musculoskeletal: She exhibits no edema, tenderness or deformity.  Neurological: She is alert and oriented to person, place, and time. She has normal strength and normal reflexes. No cranial nerve deficit or sensory deficit. Gait normal. GCS eye subscore is 4. GCS verbal subscore is 5. GCS motor subscore is 6.  Skin: Skin is warm and dry. She is not diaphoretic. No pallor.    Results for orders placed or performed in visit on 03/15/17 (from the past 72 hour(s))  POCT urine pregnancy     Status: None   Collection Time: 03/15/17 11:41 AM  Result Value Ref Range   Preg Test, Ur Negative Negative    No results found.  ASSESSMENT AND PLAN:  Fayne Mediatelbony was seen today for shoulder pain.  Diagnoses and all orders for this visit:  Trapezius muscle spasm: HPI and examination are reassuring.  Will treat symptomatically.  RTC in the next 10-12 days if she remains symptomatic sooner if worse.  We could consider imaging at that time. -     cyclobenzaprine (FLEXERIL) 10 MG tablet; Take 0.5-1 tablets (5-10 mg total) by mouth 3 (three) times daily as needed. -     meloxicam (MOBIC) 15 MG  tablet; Take 1 tablet (15 mg total) by mouth daily. Do not take ibuprofen, aspirin, naproxen with this medication.  Neck pain without injury -     POCT urine pregnancy    The patient is advised to call or return to clinic if she does not see an improvement in symptoms, or to seek the care of the closest emergency department if she worsens with the above plan.   Deliah Boston, MHS, PA-C [facility] 03/15/2017 1:35 PM

## 2018-08-29 ENCOUNTER — Other Ambulatory Visit: Payer: Self-pay | Admitting: Pediatric Intensive Care

## 2018-08-29 DIAGNOSIS — Z20822 Contact with and (suspected) exposure to covid-19: Secondary | ICD-10-CM

## 2018-09-05 LAB — NOVEL CORONAVIRUS, NAA: SARS-CoV-2, NAA: NOT DETECTED

## 2019-02-14 ENCOUNTER — Other Ambulatory Visit: Payer: Self-pay

## 2019-02-14 ENCOUNTER — Emergency Department (HOSPITAL_COMMUNITY): Payer: Medicaid Other

## 2019-02-14 ENCOUNTER — Encounter (HOSPITAL_COMMUNITY): Payer: Self-pay

## 2019-02-14 ENCOUNTER — Emergency Department (HOSPITAL_COMMUNITY)
Admission: EM | Admit: 2019-02-14 | Discharge: 2019-02-14 | Disposition: A | Discharge status: Home / Self Care | Payer: Medicaid Other | Attending: Emergency Medicine | Admitting: Emergency Medicine

## 2019-02-14 DIAGNOSIS — N939 Abnormal uterine and vaginal bleeding, unspecified: Secondary | ICD-10-CM

## 2019-02-14 DIAGNOSIS — O30041 Twin pregnancy, dichorionic/diamniotic, first trimester: Secondary | ICD-10-CM

## 2019-02-14 DIAGNOSIS — Z79899 Other long term (current) drug therapy: Secondary | ICD-10-CM | POA: Diagnosis not present

## 2019-02-14 DIAGNOSIS — O4691 Antepartum hemorrhage, unspecified, first trimester: Secondary | ICD-10-CM | POA: Diagnosis present

## 2019-02-14 DIAGNOSIS — Z3A10 10 weeks gestation of pregnancy: Secondary | ICD-10-CM | POA: Diagnosis not present

## 2019-02-14 DIAGNOSIS — O200 Threatened abortion: Secondary | ICD-10-CM | POA: Insufficient documentation

## 2019-02-14 LAB — I-STAT BETA HCG BLOOD, ED (MC, WL, AP ONLY): I-stat hCG, quantitative: >2000 m[IU]/mL — ABNORMAL HIGH (ref <5)

## 2019-02-14 LAB — HCG, QUANTITATIVE, PREGNANCY: hCG, Beta Chain, Quant, S: 304815 m[IU]/mL — ABNORMAL HIGH (ref <5)

## 2019-02-14 LAB — ABO/RH: ABO/RH(D): A POS

## 2019-02-14 NOTE — ED Triage Notes (Signed)
Patient states she began having abdominal cramping, low back pain at 1500 today. patient states by the times she got home, the abdominal cramping had worsened, and vaginal bleeding was darker.

## 2019-02-14 NOTE — ED Notes (Signed)
Pelvic setup and ready at bedside 

## 2019-02-14 NOTE — ED Notes (Signed)
Pt was verbalized discharge instructions. Pt had no further questions at this time. NAD. 

## 2019-02-24 NOTE — ED Provider Notes (Signed)
[facility]-EMERGENCY DEPT Provider Note   CSN: [id number] Arrival date & time: 02/14/19  1756     History Chief Complaint  Patient presents with  . Vaginal Bleeding  . Abdominal Pain  . Back Pain  . [redacted] weeks pregnant    Pamela Skinner is a 38 y.o. female.  HPI   38 year old female with abdominal cramping and vaginal bleeding.  Some lower back pain.  Symptom onset around 3 PM today.  Pain is intermittent.  Describes vaginal bleeding as spotting.  Less than a normal period.  She is in her first trimester pregnancy.  She estimates that she is about 5 weeks.  She has not had prenatal care with this pregnancy yet.  Past Medical History:  Diagnosis Date  . Anemia     Patient Active Problem List   Diagnosis Date Noted  . Ringworm of body 10/20/2016    Past Surgical History:  Procedure Laterality Date  . TOOTH EXTRACTION    . UTERINE FIBROID SURGERY       OB History    Gravida  1   Para      Term      Preterm      AB      Living        SAB      TAB      Ectopic      Multiple      Live Births              Family History  Problem Relation Age of Onset  . Hyperlipidemia Sister   . Heart disease Brother   . Hyperlipidemia Brother   . Cancer Sister   . Hyperlipidemia Brother     Social History   Tobacco Use  . Smoking status: Never Smoker  . Smokeless tobacco: Never Used  Substance Use Topics  . Alcohol use: No  . Drug use: No    Home Medications Prior to Admission medications   Medication Sig Start Date End Date Taking? Authorizing Provider  Prenatal Vit-Fe Fumarate-FA (PRENATAL MULTIVITAMIN) TABS tablet Take 1 tablet by mouth daily at 12 noon.   Yes [provider]  clotrimazole-betamethasone (LOTRISONE) cream Apply 1 application topically 2 (two) times daily. Patient not taking: Reported on 02/14/2019 10/20/16   Georgina QuintSagardia, Miguel Jose, MD  cyclobenzaprine (FLEXERIL) 10 MG tablet Take 0.5-1 tablets (5-10 mg  total) by mouth 3 (three) times daily as needed. Patient not taking: Reported on 02/14/2019 03/15/17   Ofilia Neaslark, Michael L, PA-C  meloxicam (MOBIC) 15 MG tablet Take 1 tablet (15 mg total) by mouth daily. Do not take ibuprofen, aspirin, naproxen with this medication. Patient not taking: Reported on 02/14/2019 03/15/17   Ofilia Neaslark, Michael L, PA-C  promethazine (PHENERGAN) 25 MG tablet Take 1 tablet (25 mg total) by mouth every 6 (six) hours as needed for nausea. Patient not taking: Reported on 05/26/2015 04/27/13   Ivonne Andrewammen, Peter, PA-C    Allergies    Patient has no known allergies.  Review of Systems   Review of Systems All systems reviewed and negative, other than as noted in HPI.  Physical Exam Updated Vital Signs BP 123/72 (BP Location: Left Arm)   Pulse 84   Temp 98.5 F (36.9 C) (Oral)   Resp 17   Ht 5\' 3"  (1.6 m)   Wt 87.5 kg   LMP 12/13/2018   SpO2 100%   BMI 34.19 kg/m   Physical Exam Vitals and nursing note reviewed.  Constitutional:  General: She is not in acute distress.    Appearance: She is well-developed.  HENT:     Head: Normocephalic and atraumatic.  Eyes:     General:        Right eye: No discharge.        Left eye: No discharge.     Conjunctiva/sclera: Conjunctivae normal.  Cardiovascular:     Rate and Rhythm: Normal rate and regular rhythm.     Heart sounds: Normal heart sounds. No murmur. No friction rub. No gallop.   Pulmonary:     Effort: Pulmonary effort is normal. No respiratory distress.     Breath sounds: Normal breath sounds.  Abdominal:     General: There is no distension.     Palpations: Abdomen is soft.     Tenderness: There is no abdominal tenderness.  Musculoskeletal:        General: No tenderness.     Cervical back: Neck supple.  Skin:    General: Skin is warm and dry.  Neurological:     Mental Status: She is alert.  Psychiatric:        Behavior: Behavior normal.        Thought Content: Thought content normal.     ED Results /  Procedures / Treatments   Labs (all labs ordered are listed, but only abnormal results are displayed) Labs Reviewed  HCG, QUANTITATIVE, PREGNANCY - Abnormal; Notable for the following components:      Result Value   hCG, Beta Chain, [place] Longest 431,540 (*)    All other components within normal limits  I-STAT BETA HCG BLOOD, ED ([facility], [facility], [facility] ONLY) - Abnormal; Notable for the following components:   I-stat hCG, quantitative >2,000.0 (*)    All other components within normal limits  ABO/RH    EKG None  Radiology No results found.   US OB Comp Less 14 Wks  Result Date: 02/14/2019 CLINICAL DATA:  Pelvic pain and vaginal bleeding EXAM: TWIN OBSTETRICAL ULTRASOUND <14 WKS COMPARISON:  None. FINDINGS: Number of IUPs:  2 Chorionicity/Amnionicity:  Dichorionic-diamniotic (thick membrane) TWIN 1 Yolk sac:  Visualized. Embryo:  Visualized. Cardiac Activity: Visualized. Heart Rate: 163 bpm CRL:   32.7 mm   10 w 1 d                  [place] EDC: 09/11/2019 TWIN 2 Yolk sac:  Visualized. Embryo:  Visualized. Cardiac Activity: Visualized. Heart Rate: 178 bpm CRL:   30.1 mm   9 w 6 d                  [place] EDC: 09/13/2019 Subchorionic hemorrhage:  None visualized. Maternal uterus/adnexae: Uterus measures 17.3 x 8 8 x 10 cm with estimated volume 127 mL. There are multiple uterine fibroids within the uterus including a large left posterior fibroid at the uterine body measuring 5.1 x 4.9 x 5.7 cm and an additional more anterior left uterine body fibroid measuring 6 x 3.6 x 4.9 cm. Normal appearance of the ovaries. No free fluid in the pelvis. IMPRESSION: Intrauterine dichorionic-diamniotic twin gestation with concordant dating at this time. There are large intramural fibroids within uterine body measuring up to 5.7 and 6.0 cm in size. Electronically Signed   By: Kreg Shropshire M.D.   On: 02/14/2019 20:34   US OB Comp AddL Gest Less 14 Wks  Result Date: 02/14/2019 CLINICAL DATA:  Pelvic pain and vaginal bleeding EXAM: TWIN  OBSTETRICAL ULTRASOUND <14 WKS COMPARISON:  None. FINDINGS: Number  of IUPs:  2 Chorionicity/Amnionicity:  Dichorionic-diamniotic (thick membrane) TWIN 1 Yolk sac:  Visualized. Embryo:  Visualized. Cardiac Activity: Visualized. Heart Rate: 163 bpm CRL:   32.7 mm   10 w 1 d                  [place] EDC: 09/11/2019 TWIN 2 Yolk sac:  Visualized. Embryo:  Visualized. Cardiac Activity: Visualized. Heart Rate: 178 bpm CRL:   30.1 mm   9 w 6 d                  [place] EDC: 09/13/2019 Subchorionic hemorrhage:  None visualized. Maternal uterus/adnexae: Uterus measures 17.3 x 8 8 x 10 cm with estimated volume 127 mL. There are multiple uterine fibroids within the uterus including a large left posterior fibroid at the uterine body measuring 5.1 x 4.9 x 5.7 cm and an additional more anterior left uterine body fibroid measuring 6 x 3.6 x 4.9 cm. Normal appearance of the ovaries. No free fluid in the pelvis. IMPRESSION: Intrauterine dichorionic-diamniotic twin gestation with concordant dating at this time. There are large intramural fibroids within uterine body measuring up to 5.7 and 6.0 cm in size. Electronically Signed   By: Lovena Le M.D.   On: 02/14/2019 20:34    Procedures Procedures (including critical care time)  Medications Ordered in ED Medications - No data to display  ED Course  I have reviewed the triage vital signs and the nursing notes.  Pertinent labs & imaging results that were available during my care of the patient were reviewed by me and considered in my medical decision making (see chart for details).    MDM Rules/Calculators/A&P                      38 year old female with some abdominal cramping and light bleeding in early pregnancy.  Ultrasound with twin gestation.  Some intramural fibroids.  Abdominal exam benign.  Exam is reassuring.  Continue prenatal vitamins.  Close OB follow-up.  Return precautions discussed. Final Clinical Impression(s) / ED Diagnoses Final diagnoses:  Threatened  abortion in first trimester  Dichorionic diamniotic twin pregnancy in first trimester    Rx / DC Orders ED Discharge Orders    None       Virgel Manifold, MD 02/24/19 [phone]

## 2019-08-15 ENCOUNTER — Inpatient Hospital Stay (HOSPITAL_COMMUNITY)
Admission: AD | Admit: 2019-08-15 | Discharge: 2019-08-15 | Disposition: A | Discharge status: Home / Self Care | Payer: Medicaid Other | Attending: Family Medicine | Admitting: Family Medicine

## 2019-08-15 ENCOUNTER — Encounter (HOSPITAL_COMMUNITY): Payer: Self-pay | Admitting: Family Medicine

## 2019-08-15 DIAGNOSIS — Z791 Long term (current) use of non-steroidal anti-inflammatories (NSAID): Secondary | ICD-10-CM | POA: Insufficient documentation

## 2019-08-15 DIAGNOSIS — O10913 Unspecified pre-existing hypertension complicating pregnancy, third trimester: Secondary | ICD-10-CM | POA: Insufficient documentation

## 2019-08-15 DIAGNOSIS — O26893 Other specified pregnancy related conditions, third trimester: Secondary | ICD-10-CM

## 2019-08-15 DIAGNOSIS — O99613 Diseases of the digestive system complicating pregnancy, third trimester: Secondary | ICD-10-CM | POA: Insufficient documentation

## 2019-08-15 DIAGNOSIS — Z79899 Other long term (current) drug therapy: Secondary | ICD-10-CM | POA: Insufficient documentation

## 2019-08-15 DIAGNOSIS — Z3A35 35 weeks gestation of pregnancy: Secondary | ICD-10-CM | POA: Insufficient documentation

## 2019-08-15 DIAGNOSIS — O30043 Twin pregnancy, dichorionic/diamniotic, third trimester: Secondary | ICD-10-CM | POA: Diagnosis not present

## 2019-08-15 DIAGNOSIS — K5909 Other constipation: Secondary | ICD-10-CM | POA: Insufficient documentation

## 2019-08-15 DIAGNOSIS — O1493 Unspecified pre-eclampsia, third trimester: Secondary | ICD-10-CM

## 2019-08-15 DIAGNOSIS — R1011 Right upper quadrant pain: Secondary | ICD-10-CM | POA: Diagnosis not present

## 2019-08-15 LAB — CBC
HCT: 33.8 % — ABNORMAL LOW (ref 36.0–46.0)
Hemoglobin: 10.4 g/dL — ABNORMAL LOW (ref 12.0–15.0)
MCH: 23.7 pg — ABNORMAL LOW (ref 26.0–34.0)
MCHC: 30.8 g/dL (ref 30.0–36.0)
MCV: 77.2 fL — ABNORMAL LOW (ref 80.0–100.0)
Platelets: 247 10*3/uL (ref 150–400)
RBC: 4.38 MIL/uL (ref 3.87–5.11)
RDW: 18.2 % — ABNORMAL HIGH (ref 11.5–15.5)
WBC: 6.9 10*3/uL (ref 4.0–10.5)
nRBC: 0 % (ref 0.0–0.2)

## 2019-08-15 LAB — URINALYSIS, ROUTINE W REFLEX MICROSCOPIC
Bilirubin Urine: NEGATIVE
Glucose, UA: NEGATIVE mg/dL
Hgb urine dipstick: NEGATIVE
Ketones, ur: 5 mg/dL — AB
Nitrite: NEGATIVE
Protein, ur: 100 mg/dL — AB
Specific Gravity, Urine: 1.018 (ref 1.005–1.030)
WBC, UA: >50 WBC/hpf — ABNORMAL HIGH (ref 0–5)
pH: 5 (ref 5.0–8.0)

## 2019-08-15 LAB — COMPREHENSIVE METABOLIC PANEL
ALT: 13 U/L (ref 0–44)
AST: 20 U/L (ref 15–41)
Albumin: 2.7 g/dL — ABNORMAL LOW (ref 3.5–5.0)
Alkaline Phosphatase: 104 U/L (ref 38–126)
Anion gap: 8 (ref 5–15)
BUN: <5 mg/dL — ABNORMAL LOW (ref 6–20)
CO2: 23 mmol/L (ref 22–32)
Calcium: 8.9 mg/dL (ref 8.9–10.3)
Chloride: 105 mmol/L (ref 98–111)
Creatinine, Ser: 0.69 mg/dL (ref 0.44–1.00)
GFR calc Af Amer: >60 mL/min (ref >60)
GFR calc non Af Amer: >60 mL/min (ref >60)
Glucose, Bld: 78 mg/dL (ref 70–99)
Potassium: 3.6 mmol/L (ref 3.5–5.1)
Sodium: 136 mmol/L (ref 135–145)
Total Bilirubin: 0.7 mg/dL (ref 0.3–1.2)
Total Protein: 6.5 g/dL (ref 6.5–8.1)

## 2019-08-15 LAB — PROTEIN / CREATININE RATIO, URINE
Creatinine, Urine: 258.55 mg/dL
Protein Creatinine Ratio: 0.32 mg/mg{Cre} — ABNORMAL HIGH (ref 0.00–0.15)
Total Protein, Urine: 84 mg/dL

## 2019-08-15 MED ORDER — HYOSCYAMINE SULFATE 0.125 MG SL SUBL
0.1250 mg | SUBLINGUAL_TABLET | Freq: Once | SUBLINGUAL | Status: AC
  Administered 2019-08-15: 0.125 mg via SUBLINGUAL
  Filled 2019-08-15: qty 1

## 2019-08-15 MED ORDER — SIMETHICONE 80 MG PO CHEW
80.0000 mg | CHEWABLE_TABLET | Freq: Four times a day (QID) | ORAL | Status: DC | PRN
  Administered 2019-08-15: 80 mg via ORAL
  Filled 2019-08-15: qty 1

## 2019-08-15 MED ORDER — SIMETHICONE 80 MG PO CHEW: 80.0000 mg | CHEWABLE_TABLET | Freq: Four times a day (QID) | ORAL | 0 refills | Status: AC | PRN

## 2019-08-15 MED ORDER — POLYETHYLENE GLYCOL 3350 17 G PO PACK: 17.0000 g | PACK | Freq: Every day | ORAL | 0 refills | Status: AC

## 2019-08-15 NOTE — Discharge Instructions (Signed)
Preeclampsia and Eclampsia Preeclampsia is a serious condition that may develop during pregnancy. This condition causes high blood pressure and increased protein in your urine along with other symptoms, such as headaches and vision changes. These symptoms may develop as the condition gets worse. Preeclampsia may occur at 20 weeks of pregnancy or later. Diagnosing and treating preeclampsia early is very important. If not treated early, it can cause serious problems for you and your baby. One problem it can lead to is eclampsia. Eclampsia is a condition that causes muscle jerking or shaking (convulsions or seizures) and other serious problems for the mother. During pregnancy, delivering your baby may be the best treatment for preeclampsia or eclampsia. For most women, preeclampsia and eclampsia symptoms go away after giving birth. In rare cases, a woman may develop preeclampsia after giving birth (postpartum preeclampsia). This usually occurs within 48 hours after childbirth but may occur up to 6 weeks after giving birth. What are the causes? The cause of preeclampsia is not known. What increases the risk? The following risk factors make you more likely to develop preeclampsia:  Being pregnant for the first time.  Having had preeclampsia during a past pregnancy.  Having a family history of preeclampsia.  Having high blood pressure.  Being pregnant with more than one baby.  Being 35 or older.  Being African-American.  Having kidney disease or diabetes.  Having medical conditions such as lupus or blood diseases.  Being very overweight (obese). What are the signs or symptoms? The most common symptoms are:  Severe headaches.  Vision problems, such as blurred or double vision.  Abdominal pain, especially upper abdominal pain. Other symptoms that may develop as the condition gets worse include:  Sudden weight gain.  Sudden swelling of the hands, face, legs, and feet.  Severe nausea  and vomiting.  Numbness in the face, arms, legs, and feet.  Dizziness.  Urinating less than usual.  Slurred speech.  Convulsions or seizures. How is this diagnosed? There are no screening tests for preeclampsia. Your health care provider will ask you about symptoms and check for signs of preeclampsia during your prenatal visits. You may also have tests that include:  Checking your blood pressure.  Urine tests to check for protein. Your health care provider will check for this at every prenatal visit.  Blood tests.  Monitoring your baby's heart rate.  Ultrasound. How is this treated? You and your health care provider will determine the treatment approach that is best for you. Treatment may include:  Having more frequent prenatal exams to check for signs of preeclampsia, if you have an increased risk for preeclampsia.  Medicine to lower your blood pressure.  Staying in the hospital, if your condition is severe. There, treatment will focus on controlling your blood pressure and the amount of fluids in your body (fluid retention).  Taking medicine (magnesium sulfate) to prevent seizures. This may be given as an injection or through an IV.  Taking a low-dose aspirin during your pregnancy.  Delivering your baby early. You may have your labor started with medicine (induced), or you may have a cesarean delivery. Follow these instructions at home: Eating and drinking   Drink enough fluid to keep your urine pale yellow.  Avoid caffeine. Lifestyle  Do not use any products that contain nicotine or tobacco, such as cigarettes and e-cigarettes. If you need help quitting, ask your health care provider.  Do not use alcohol or drugs.  Avoid stress as much as possible. Rest and get   plenty of sleep. General instructions  Take over-the-counter and prescription medicines only as told by your health care provider.  When lying down, lie on your left side. This keeps pressure off your  major blood vessels.  When sitting or lying down, raise (elevate) your feet. Try putting some pillows underneath your lower legs.  Exercise regularly. Ask your health care provider what kinds of exercise are best for you.  Keep all follow-up and prenatal visits as told by your health care provider. This is important. How is this prevented? There is no known way of preventing preeclampsia or eclampsia from developing. However, to lower your risk of complications and detect problems early:  Get regular prenatal care. Your health care provider may be able to diagnose and treat the condition early.  Maintain a healthy weight. Ask your health care provider for help managing weight gain during pregnancy.  Work with your health care provider to manage any long-term (chronic) health conditions you have, such as diabetes or kidney problems.  You may have tests of your blood pressure and kidney function after giving birth.  Your health care provider may have you take low-dose aspirin during your next pregnancy. Contact a health care provider if:  You have symptoms that your health care provider told you may require more treatment or monitoring, such as: ? Headaches. ? Nausea or vomiting. ? Abdominal pain. ? Dizziness. ? Light-headedness. Get help right away if:  You have severe: ? Abdominal pain. ? Headaches that do not get better. ? Dizziness. ? Vision problems. ? Confusion. ? Nausea or vomiting.  You have any of the following: ? A seizure. ? Sudden, rapid weight gain. ? Sudden swelling in your hands, ankles, or face. ? Trouble moving any part of your body. ? Numbness in any part of your body. ? Trouble speaking. ? Abnormal bleeding.  You faint. Summary  Preeclampsia is a serious condition that may develop during pregnancy.  This condition causes high blood pressure and increased protein in your urine along with other symptoms, such as headaches and vision  changes.  Diagnosing and treating preeclampsia early is very important. If not treated early, it can cause serious problems for you and your baby.  Get help right away if you have symptoms that your health care provider told you to watch for. This information is not intended to replace advice given to you by your health care provider. Make sure you discuss any questions you have with your health care provider. Document Revised: 10/17/2017 Document Reviewed: 09/21/2015 Elsevier Patient Education  2020 [employer]  

## 2019-08-15 NOTE — MAU Provider Note (Addendum)
History     CSN: [id number]  Arrival date and time: 08/15/19 1714   First Provider Initiated Contact with Patient 08/15/19 1901      No chief complaint on file.  [name] is a 39 y.o. female G1P0 @ [redacted]w[redacted]d Di/Di twins, here in MAU with RUQ pain that started yesterday. She is receiving her care outside of [facility] and is visiting here with family. States the pain comes and goes. When the pain went away she wasn't concerned however when it returned it was worse. She has no associated N/V. No HA or scotoma. No worsening of pain with food.   Was diagnosed with GHTN and borderline Pre E. She was admitted to the hospital on 6/4 and sent home after 2 weeks. Gets care in [facility] and has a C/S planned  Hypertension This is a recurrent problem. Associated symptoms include peripheral edema. Pertinent negatives include no anxiety, blurred vision, chest pain, headaches or shortness of breath. There are no associated agents to hypertension.      OB History    Gravida  1   Para      Term      Preterm      AB      Living        SAB      TAB      Ectopic      Multiple      Live Births              Past Medical History:  Diagnosis Date  . Anemia     Past Surgical History:  Procedure Laterality Date  . TOOTH EXTRACTION    . UTERINE FIBROID SURGERY      Family History  Problem Relation Age of Onset  . Hyperlipidemia Sister   . Heart disease Brother   . Hyperlipidemia Brother   . Cancer Sister   . Hyperlipidemia Brother     Social History   Tobacco Use  . Smoking status: Never Smoker  . Smokeless tobacco: Never Used  Vaping Use  . Vaping Use: Never used  Substance Use Topics  . Alcohol use: No  . Drug use: No    Allergies: No Known Allergies  Medications Prior to Admission  Medication Sig Dispense Refill Last Dose  . cephALEXin (KEFLEX) 500 MG capsule Take 500 mg by mouth 4 (four) times daily.   08/15/2019 at Unknown time  .  Doxylamine-Pyridoxine (DICLEGIS PO) Take by mouth.   08/14/2019 at Unknown time  . famotidine (PEPCID) 40 MG tablet Take 40 mg by mouth daily.     . ferrous sulfate 325 (65 FE) MG tablet Take 325 mg by mouth daily with breakfast.     . Prenatal Vit-Fe Fumarate-FA (PRENATAL MULTIVITAMIN) TABS tablet Take 1 tablet by mouth daily at 12 noon.   08/14/2019 at Unknown time  . clotrimazole-betamethasone (LOTRISONE) cream Apply 1 application topically 2 (two) times daily. (Patient not taking: Reported on 02/14/2019) 30 g 0   . cyclobenzaprine (FLEXERIL) 10 MG tablet Take 0.5-1 tablets (5-10 mg total) by mouth 3 (three) times daily as needed. (Patient not taking: Reported on 02/14/2019) 30 tablet 0   . meloxicam (MOBIC) 15 MG tablet Take 1 tablet (15 mg total) by mouth daily. Do not take ibuprofen, aspirin, naproxen with this medication. (Patient not taking: Reported on 02/14/2019) 21 tablet 0   . promethazine (PHENERGAN) 25 MG tablet Take 1 tablet (25 mg total) by mouth every 6 (six) hours as needed for  nausea. (Patient not taking: Reported on 05/26/2015) 20 tablet 0    Results for orders placed or performed during the hospital encounter of 08/15/19 (from the past 48 hour(s))  CBC     Status: Abnormal   Collection Time: 08/15/19  7:10 PM  Result Value Ref Range   WBC 6.9 4.0 - 10.5 K/uL   RBC 4.38 3.87 - 5.11 MIL/uL   Hemoglobin 10.4 (L) 12.0 - 15.0 g/dL   HCT 69.6 (L) 36 - 46 %   MCV 77.2 (L) 80.0 - 100.0 fL   MCH 23.7 (L) 26.0 - 34.0 pg   MCHC 30.8 30.0 - 36.0 g/dL   RDW 78.9 (H) 38.1 - 01.7 %   Platelets 247 150 - 400 K/uL   nRBC 0.0 0.0 - 0.2 %    Comment: Performed at [facility], [address] [place], [place] [address]  Comprehensive metabolic panel     Status: Abnormal   Collection Time: 08/15/19  7:10 PM  Result Value Ref Range   Sodium 136 135 - 145 mmol/L   Potassium 3.6 3.5 - 5.1 mmol/L   Chloride 105 98 - 111 mmol/L   CO2 23 22 - 32 mmol/L   Glucose, Bld 78 70 - 99 mg/dL     Comment: Glucose reference range applies only to samples taken after fasting for at least 8 hours.   BUN <5 (L) 6 - 20 mg/dL   Creatinine, Ser 8.52 0.44 - 1.00 mg/dL   Calcium 8.9 8.9 - 77.8 mg/dL   Total Protein 6.5 6.5 - 8.1 g/dL   Albumin 2.7 (L) 3.5 - 5.0 g/dL   AST 20 15 - 41 U/L   ALT 13 0 - 44 U/L   Alkaline Phosphatase 104 38 - 126 U/L   Total Bilirubin 0.7 0.3 - 1.2 mg/dL   GFR calc non Af Amer >60 >60 mL/min   GFR calc Af Amer >60 >60 mL/min   Anion gap 8 5 - 15    Comment: Performed at [facility], [address], [place], [place] [address]    Review of Systems  Constitutional: Negative for fever.  Eyes: Negative for blurred vision, photophobia and visual disturbance.  Respiratory: Negative for shortness of breath.   Cardiovascular: Negative for chest pain.  Gastrointestinal: Positive for abdominal pain.  Neurological: Negative for headaches.   Physical Exam   Blood pressure (!) 152/97, pulse 72, temperature 98.3 F (36.8 C), temperature source Oral, resp. rate 18, height 5\' 5"  (1.651 m), weight 103.4 kg, last menstrual period 12/13/2018, SpO2 100 %.   Patient Vitals for the past 24 hrs:  BP Temp Temp src Pulse Resp SpO2 Height Weight  08/15/19 2015 (!) 146/94 -- -- 88 -- 100 % -- --  08/15/19 2000 (!) 146/92 -- -- 80 -- 100 % -- --  08/15/19 1945 (!) 140/95 -- -- 81 -- 100 % -- --  08/15/19 1933 (!) 143/94 -- -- 70 -- -- -- --  08/15/19 1915 (!) 158/99 -- -- 88 -- 100 % -- --  08/15/19 1900 (!) 152/97 -- -- 72 -- 100 % -- --  08/15/19 1845 (!) 151/89 -- -- 68 -- 100 % -- --  08/15/19 1830 (!) 155/106 -- -- 75 -- 100 % -- --  08/15/19 1818 (!) 144/95 -- -- 73 -- -- -- --  08/15/19 1748 (!) 152/93 98.3 F (36.8 C) Oral 81 18 100 % 5\' 5"  (1.651 m) 103.4 kg  Physical Exam  Cardiovascular: Normal rate.  Respiratory: Breath sounds normal.  GI: Soft. Normal appearance. There is abdominal tenderness. There is no guarding and negative Murphy's sign.   Musculoskeletal:        General: Normal range of motion.     Cervical back: Normal range of motion and neck supple.  Neurological: She is alert.  Psychiatric: Mood normal.   Fetal Tracing Fetus A Baseline: 130 bpm Variability: Moderate  Accelerations: 15x15 Decelerations: None Toco: occasional with UI  Fetal Tracing Fetus B Baseline: 135 bpm Variability: Moderate  Accelerations: 15x15 Decelerations: None  Results for orders placed or performed during the hospital encounter of 08/15/19 (from the past 24 hour(s))  CBC     Status: Abnormal   Collection Time: 08/15/19  7:10 PM  Result Value Ref Range   WBC 6.9 4.0 - 10.5 K/uL   RBC 4.38 3.87 - 5.11 MIL/uL   Hemoglobin 10.4 (L) 12.0 - 15.0 g/dL   HCT 33.8 (L) 36 - 46 %   MCV 77.2 (L) 80.0 - 100.0 fL   MCH 23.7 (L) 26.0 - 34.0 pg   MCHC 30.8 30.0 - 36.0 g/dL   RDW 18.2 (H) 11.5 - 15.5 %   Platelets 247 150 - 400 K/uL   nRBC 0.0 0.0 - 0.2 %  Comprehensive metabolic panel     Status: Abnormal   Collection Time: 08/15/19  7:10 PM  Result Value Ref Range   Sodium 136 135 - 145 mmol/L   Potassium 3.6 3.5 - 5.1 mmol/L   Chloride 105 98 - 111 mmol/L   CO2 23 22 - 32 mmol/L   Glucose, Bld 78 70 - 99 mg/dL   BUN <5 (L) 6 - 20 mg/dL   Creatinine, Ser 0.69 0.44 - 1.00 mg/dL   Calcium 8.9 8.9 - 10.3 mg/dL   Total Protein 6.5 6.5 - 8.1 g/dL   Albumin 2.7 (L) 3.5 - 5.0 g/dL   AST 20 15 - 41 U/L   ALT 13 0 - 44 U/L   Alkaline Phosphatase 104 38 - 126 U/L   Total Bilirubin 0.7 0.3 - 1.2 mg/dL   GFR calc non Af Amer >60 >60 mL/min   GFR calc Af Amer >60 >60 mL/min   Anion gap 8 5 - 15  Protein / creatinine ratio, urine     Status: Abnormal   Collection Time: 08/15/19  7:55 PM  Result Value Ref Range   Creatinine, Urine 258.55 mg/dL   Total Protein, Urine 84 mg/dL   Protein Creatinine Ratio 0.32 (H) 0.00 - 0.15 mg/mg[Cre]  Urinalysis, Routine w reflex microscopic     Status: Abnormal   Collection Time: 08/15/19  7:55 PM  Result  Value Ref Range   Color, Urine YELLOW YELLOW   APPearance HAZY (A) CLEAR   Specific Gravity, Urine 1.018 1.005 - 1.030   pH 5.0 5.0 - 8.0   Glucose, UA NEGATIVE NEGATIVE mg/dL   Hgb urine dipstick NEGATIVE NEGATIVE   Bilirubin Urine NEGATIVE NEGATIVE   Ketones, ur 5 (A) NEGATIVE mg/dL   Protein, ur 100 (A) NEGATIVE mg/dL   Nitrite NEGATIVE NEGATIVE   Leukocytes,Ua LARGE (A) NEGATIVE   RBC / HPF 0-5 0 - 5 RBC/hpf   WBC, UA >50 (H) 0 - 5 WBC/hpf   Bacteria, UA MANY (A) NONE SEEN   Squamous Epithelial / LPF 0-5 0 - 5   Mucus PRESENT     MAU Course  Procedures  None  MDM  PIH labs  collected BP's cycling.  Report given to Wynelle Bourgeois CNM who resumes care of the patient.   Rasch, Harolyn Rutherford, NP  Reviewed labs are normal except elevated Protein/Creatinine Ratio Given Levsin and Mylicon for abdominal discomfort Pain is now much less than before "2-3" Consulted Dr Shawnie Pons with presentation and results.  She recommends discharge home with precautions and followup with her doctor this week  Assessment and Plan  Single IUP at [redacted]w[redacted]d Preeclampsia RUQ pain, responsive to Levsin and Mylicon  Discharge home Preeclampsia precautions Rx Miralax for chronic constipation Rx Simethicone 80mg  prn gas pains Followup with office as scheduled in coming week Encouraged to return here or to other Urgent Care/ED if she develops worsening of symptoms, increase in pain, fever, or other concerning symptoms.    , CNM

## 2019-08-15 NOTE — MAU Note (Addendum)
Getting care in [facility].  Here visiting, used to live here.  Yesterday afternoon, had tightening in her stomach and pain in rt rib.  Pain is not as intense- but still present. preg with twins, has fibroids.  dc'd from hosp 6/4- hosp for 2 wks for elevated BP.

## 2021-05-07 IMAGING — US US OB EACH ADDL GEST<[ID]
1 series · 13 of 28 positions shown · non-contrast
Comparison: None.

CLINICAL DATA: Pelvic pain and vaginal bleeding

EXAM:
TWIN OBSTETRICAL ULTRASOUND <14 WKS

[Series 1: us ob each addl gest<(id) · 13 of 58 slices shown]
[im 3/58]
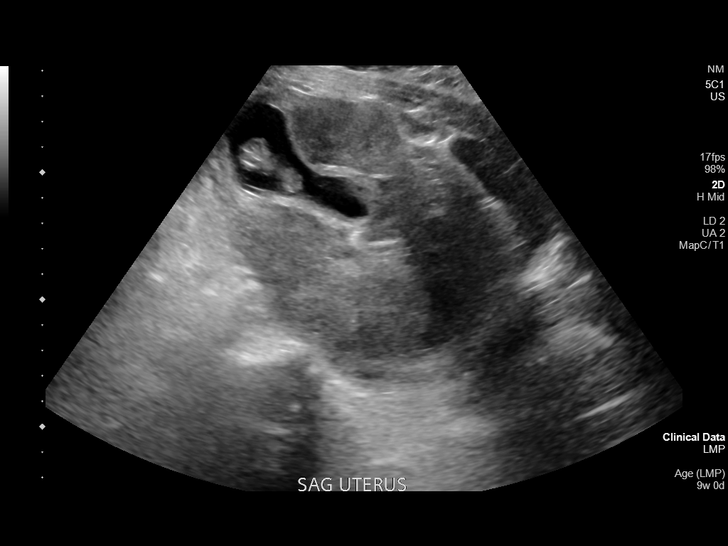
[im 7/58]
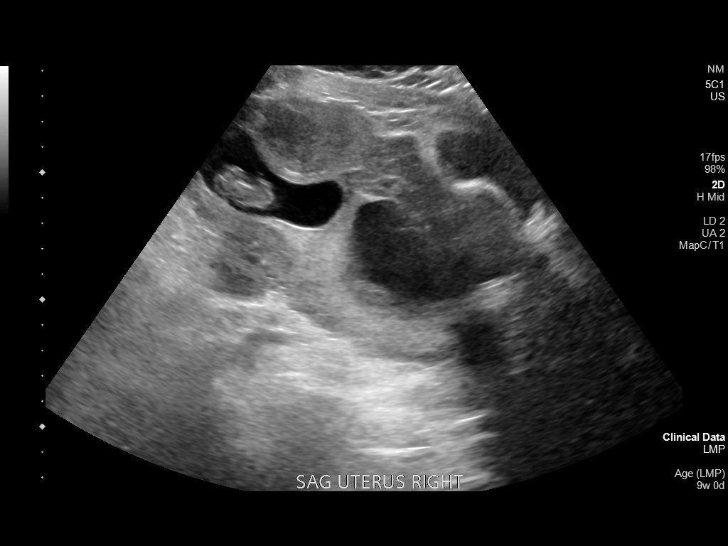
[im 11/58]
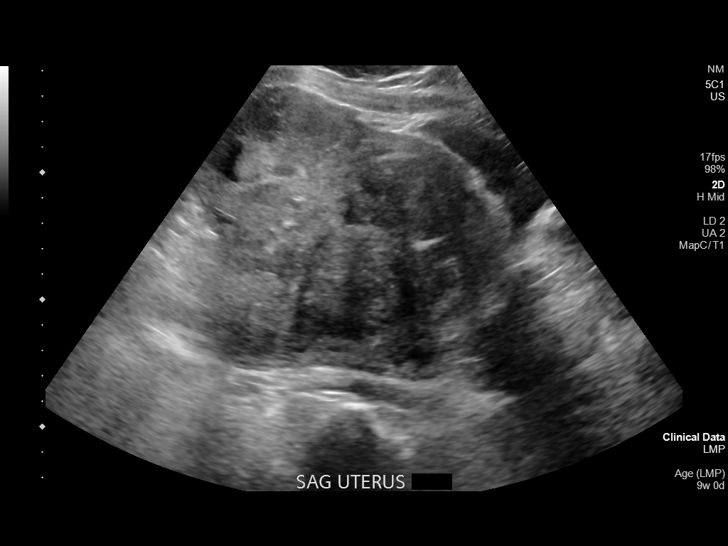
[im 15/58]
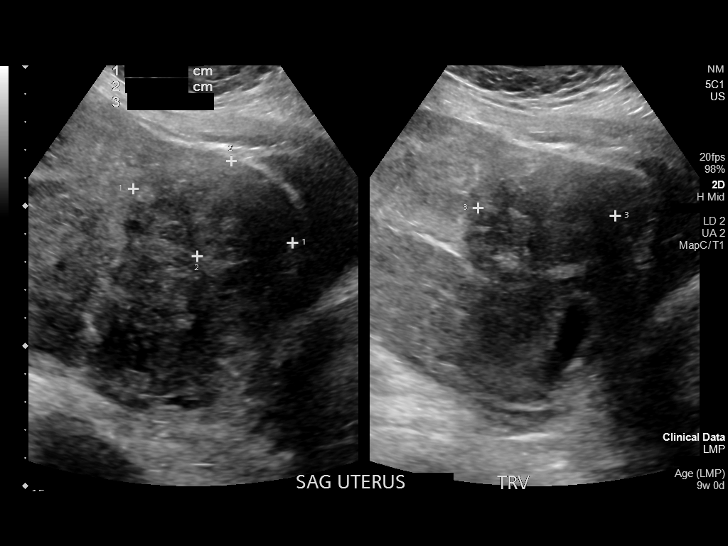
[im 20/58]
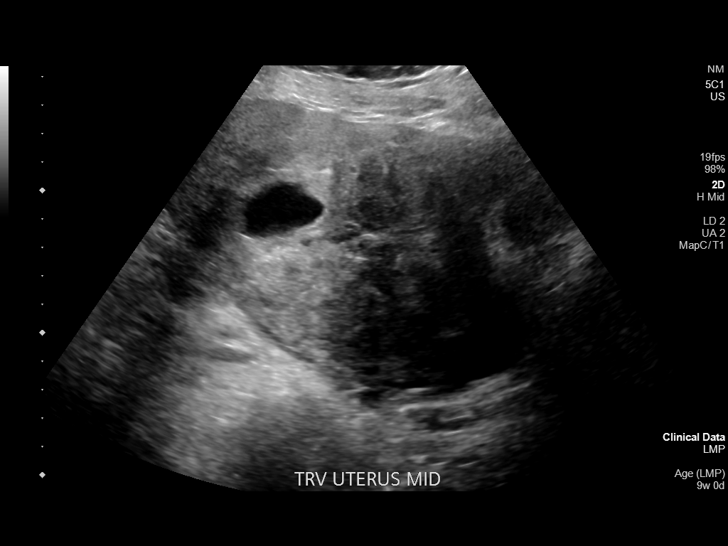
[im 24/58]
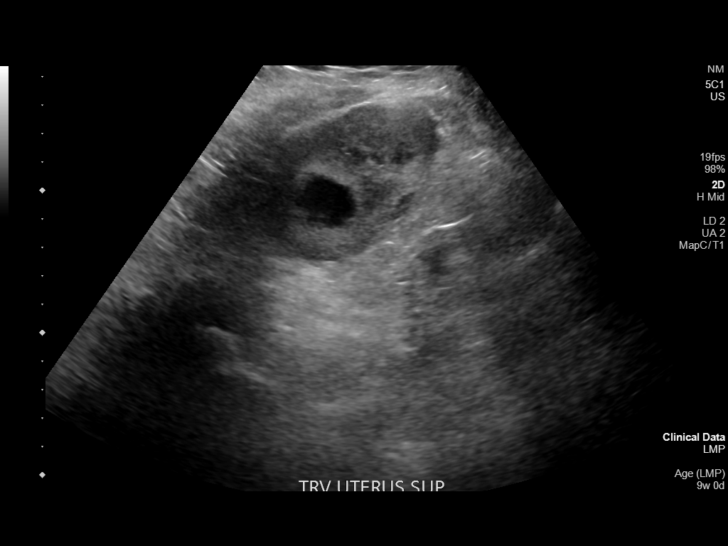
[im 30/58]
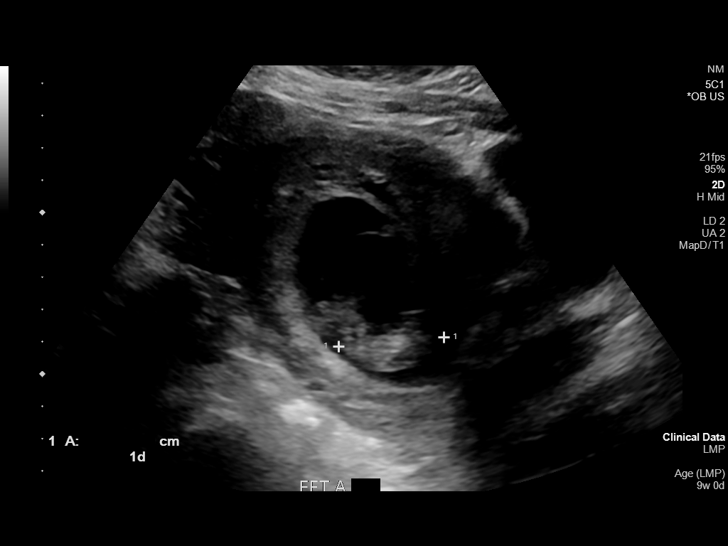
[im 34/58]
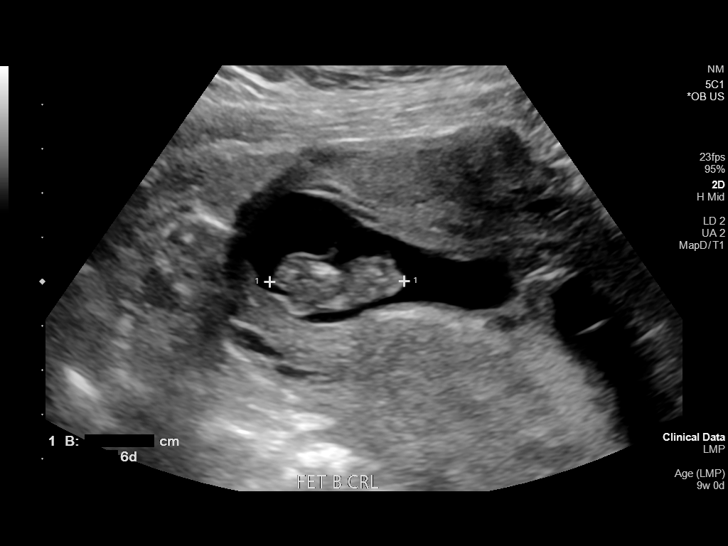
[im 39/58]
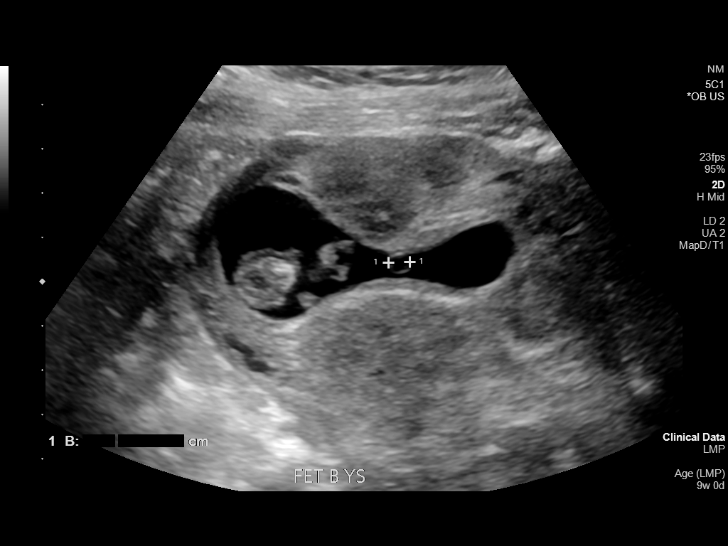
[im 43/58]
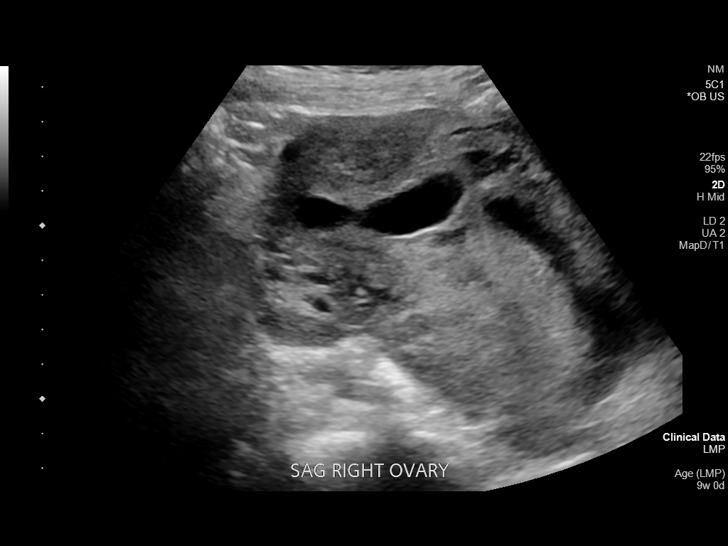
[im 47/58]
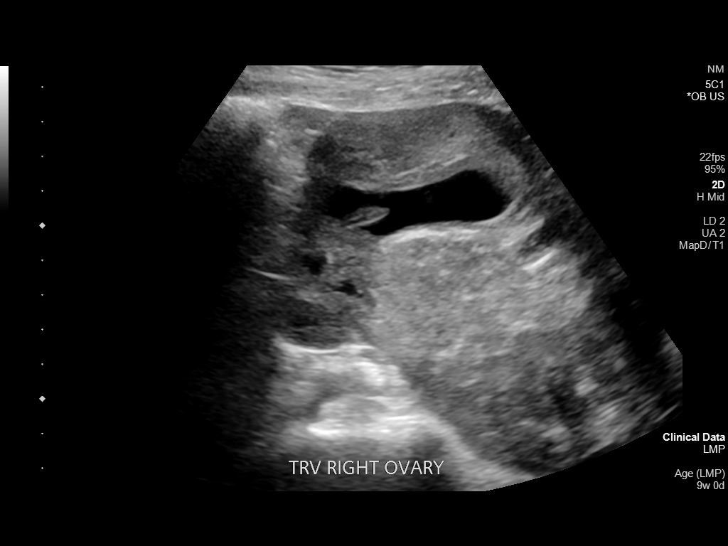
[im 51/58]
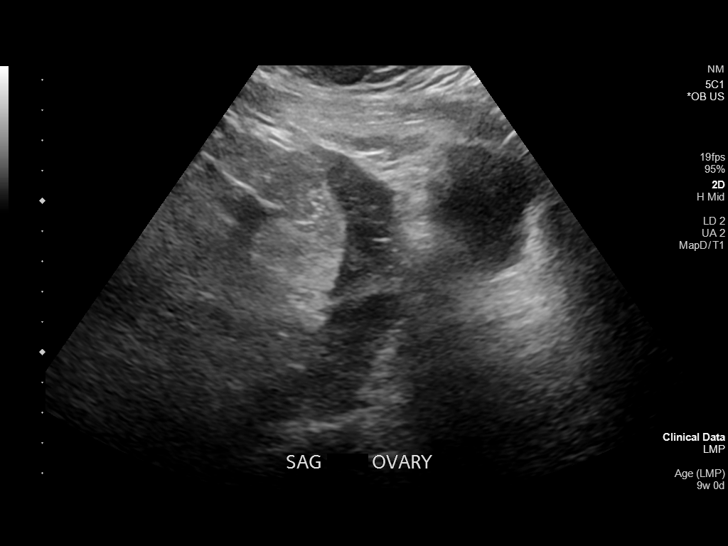
[im 55/58]
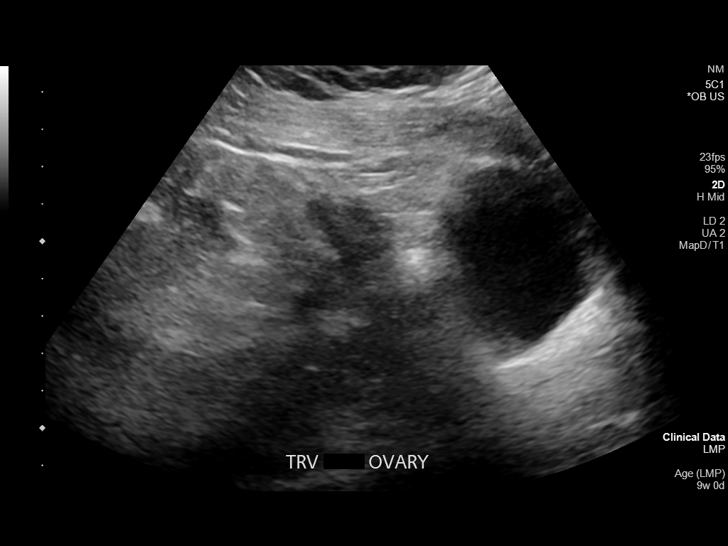

[13 of 28 positions shown; findings below may reference images not displayed]

FINDINGS: Number of IUPs:  2

Chorionicity/Amnionicity:  Dichorionic-diamniotic (thick membrane)

TWIN 1

Yolk sac:  Visualized.

Embryo:  Visualized.

Cardiac Activity: Visualized.

Heart Rate: 163 bpm

CRL:   32.7 mm   10 w 1 d                  US EDC: 09/11/2019

TWIN 2

Yolk sac:  Visualized.

Embryo:  Visualized.

Cardiac Activity: Visualized.

Heart Rate: 178 bpm

CRL:   30.1 mm   9 w 6 d                  US EDC: 09/13/2019

Subchorionic hemorrhage:  None visualized.

Maternal uterus/adnexae: Uterus measures 17.3 x 8 8 x 10 cm with
estimated volume 127 mL. There are multiple uterine fibroids within
the uterus including a large left posterior fibroid at the uterine
body measuring 5.1 x 4.9 x 5.7 cm and an additional more anterior
left uterine body fibroid measuring 6 x 3.6 x 4.9 cm. Normal
appearance of the ovaries. No free fluid in the pelvis.
IMPRESSION: Intrauterine dichorionic-diamniotic twin gestation with concordant
dating at this time.

There are large intramural fibroids within uterine body measuring up
to 5.7 and 6.0 cm in size.

## 2021-05-07 IMAGING — US US OB COMP LESS 14 WK
1 series · 14 of 28 positions shown · non-contrast
Comparison: None.

CLINICAL DATA: Pelvic pain and vaginal bleeding

EXAM:
TWIN OBSTETRICAL ULTRASOUND <14 WKS

[Series 1: us ob comp less 14 wk · 58 acquisitions, 14 frames shown]
[im 3/58]
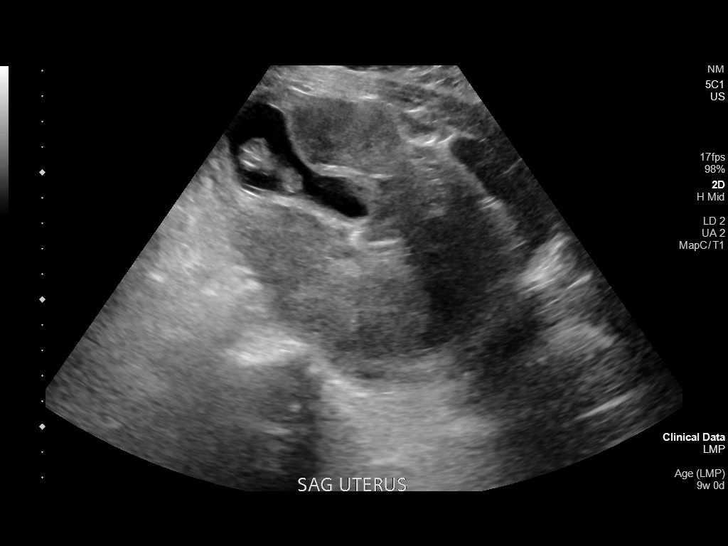
[im 7/58]
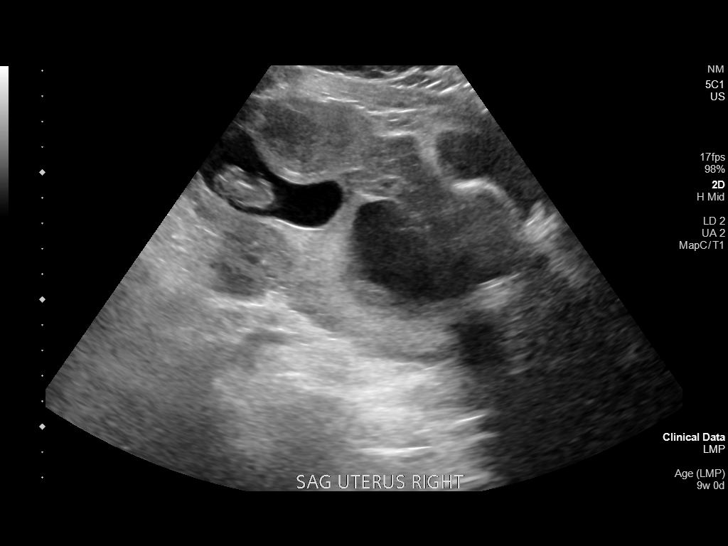
[im 11/58]
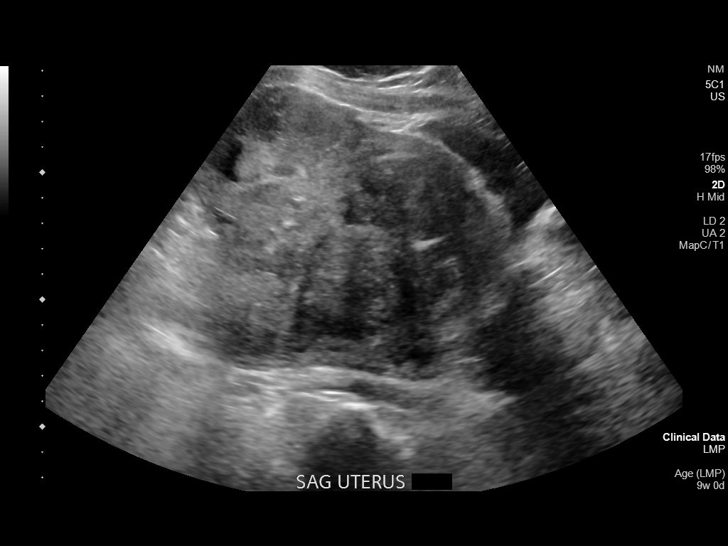
[im 15/58]
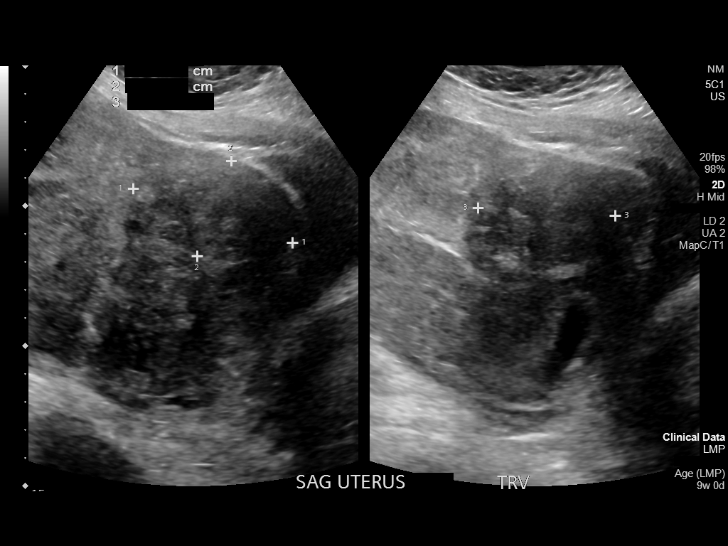
[im 20/58]
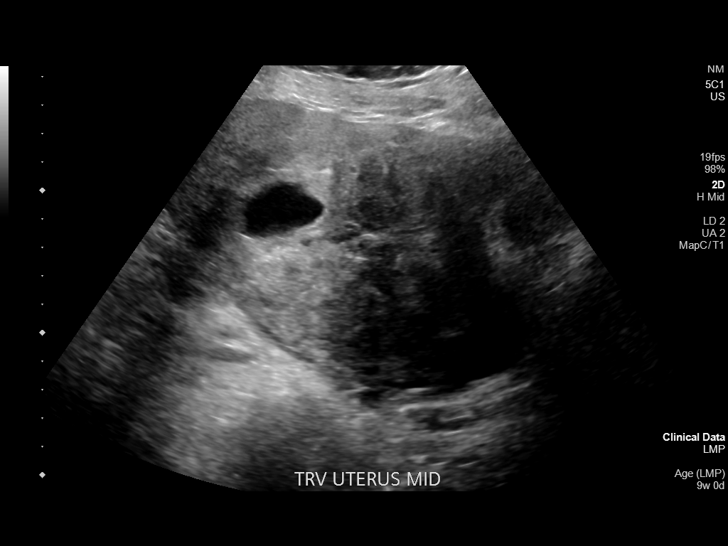
[im 24/58]
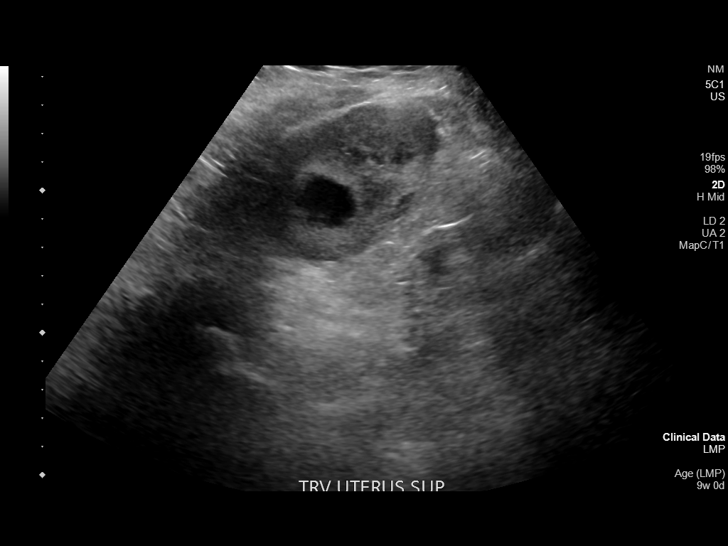
[im 28/58]
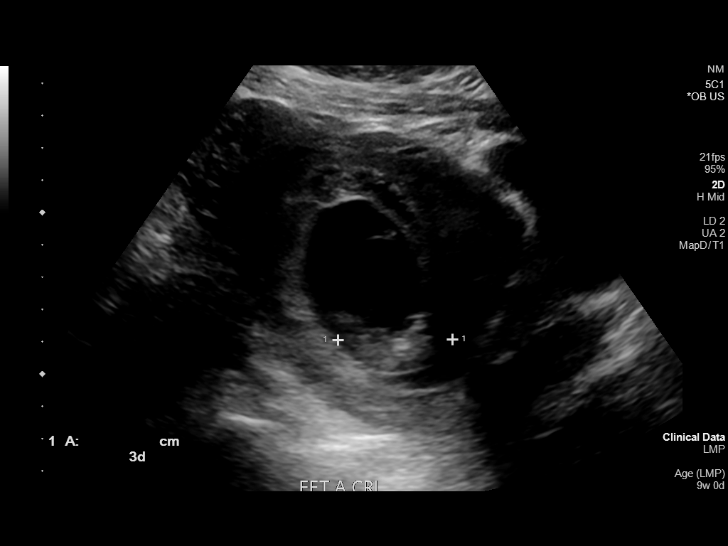
[im 32/58]
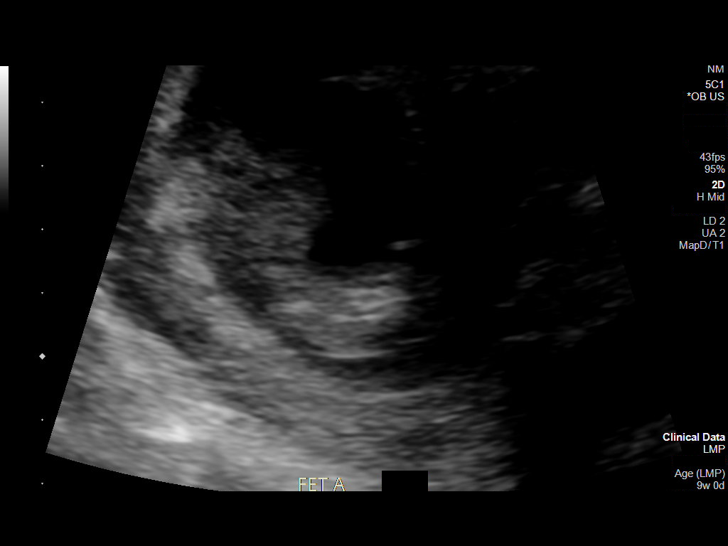
[im 36/58]
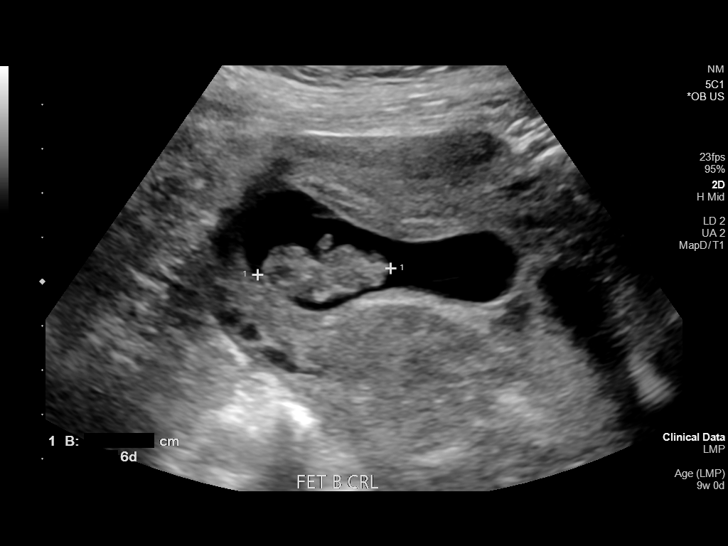
[im 41/58]
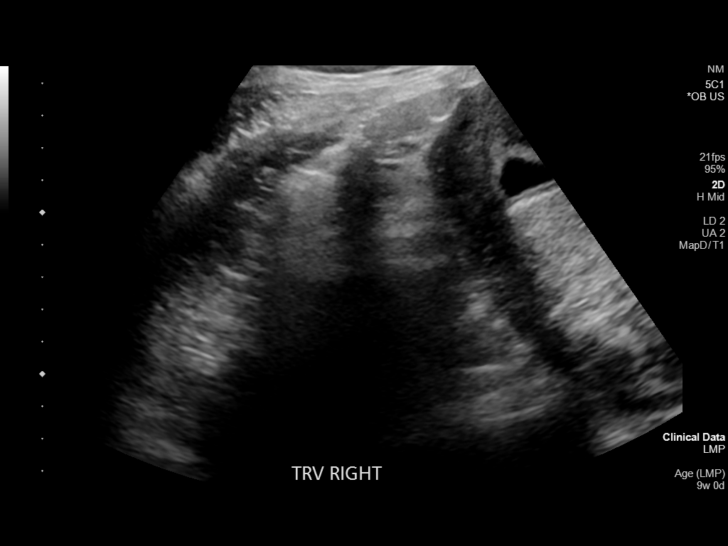
[im 45/58]
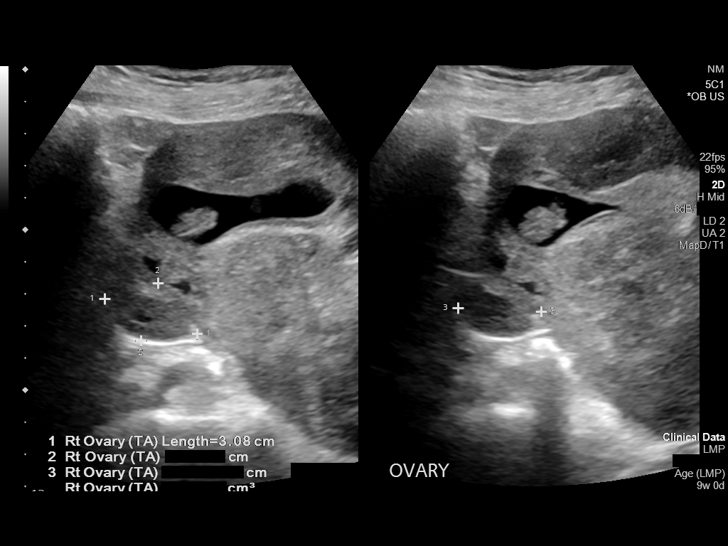
[im 49/58]
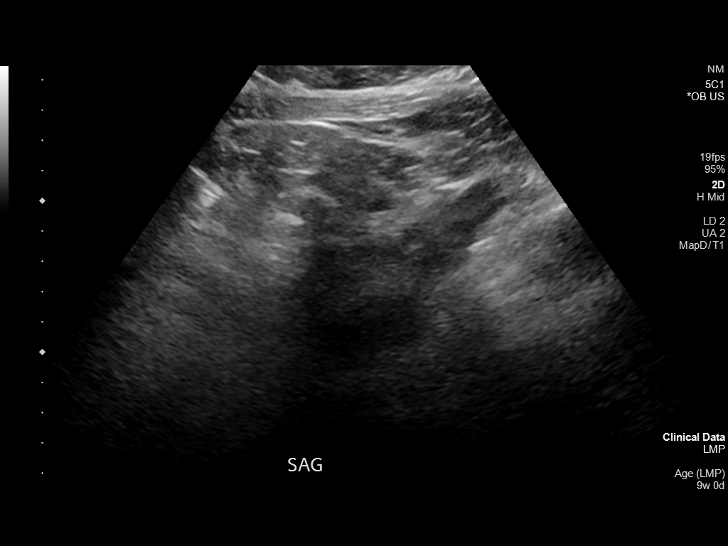
[im 53/58]
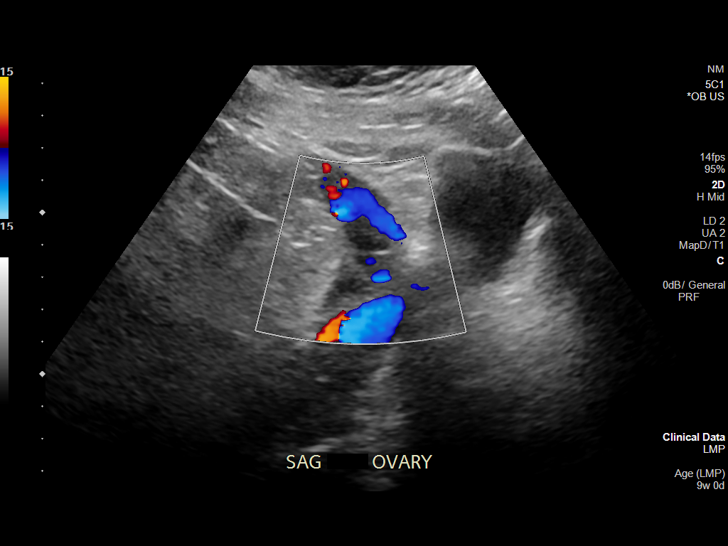
[im 58/58]
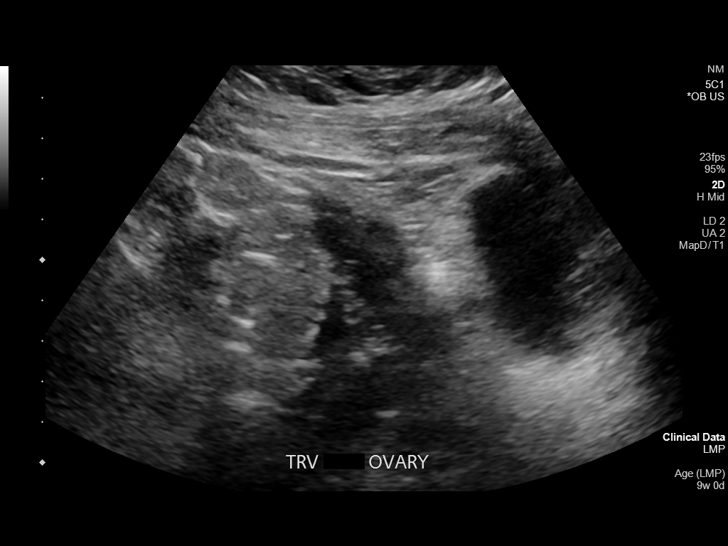

[14 of 28 positions shown; findings below may reference images not displayed]

FINDINGS: Number of IUPs:  2

Chorionicity/Amnionicity:  Dichorionic-diamniotic (thick membrane)

TWIN 1

Yolk sac:  Visualized.

Embryo:  Visualized.

Cardiac Activity: Visualized.

Heart Rate: 163 bpm

CRL:   32.7 mm   10 w 1 d                  US EDC: 09/11/2019

TWIN 2

Yolk sac:  Visualized.

Embryo:  Visualized.

Cardiac Activity: Visualized.

Heart Rate: 178 bpm

CRL:   30.1 mm   9 w 6 d                  US EDC: 09/13/2019

Subchorionic hemorrhage:  None visualized.

Maternal uterus/adnexae: Uterus measures 17.3 x 8 8 x 10 cm with
estimated volume 127 mL. There are multiple uterine fibroids within
the uterus including a large left posterior fibroid at the uterine
body measuring 5.1 x 4.9 x 5.7 cm and an additional more anterior
left uterine body fibroid measuring 6 x 3.6 x 4.9 cm. Normal
appearance of the ovaries. No free fluid in the pelvis.
IMPRESSION: Intrauterine dichorionic-diamniotic twin gestation with concordant
dating at this time.

There are large intramural fibroids within uterine body measuring up
to 5.7 and 6.0 cm in size.
